# Patient Record
Sex: Female | Born: 1995 | Race: White | Hispanic: No | Marital: Single | Smoking: Never smoker
Health system: Southern US, Community
[De-identification: ages and names within clinical notes are randomized; demographics above are authoritative.]

## PROBLEM LIST (undated history)

## (undated) DIAGNOSIS — J302 Other seasonal allergic rhinitis: Secondary | ICD-10-CM

## (undated) DIAGNOSIS — E039 Hypothyroidism, unspecified: Secondary | ICD-10-CM

## (undated) DIAGNOSIS — F4310 Post-traumatic stress disorder, unspecified: Secondary | ICD-10-CM

## (undated) DIAGNOSIS — F329 Major depressive disorder, single episode, unspecified: Secondary | ICD-10-CM

## (undated) DIAGNOSIS — N200 Calculus of kidney: Secondary | ICD-10-CM

## (undated) DIAGNOSIS — F32A Depression, unspecified: Secondary | ICD-10-CM

## (undated) HISTORY — DX: Calculus of kidney: N20.0

---
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
## 2017-08-24 ENCOUNTER — Other Ambulatory Visit: Payer: Self-pay

## 2017-08-24 DIAGNOSIS — R102 Pelvic and perineal pain: Secondary | ICD-10-CM | POA: Diagnosis not present

## 2017-08-24 DIAGNOSIS — N938 Other specified abnormal uterine and vaginal bleeding: Secondary | ICD-10-CM | POA: Insufficient documentation

## 2017-08-24 LAB — CBC
HEMATOCRIT: 41.7 % (ref 35.0–47.0)
HEMOGLOBIN: 14 g/dL (ref 12.0–16.0)
MCH: 26.4 pg (ref 26.0–34.0)
MCHC: 33.6 g/dL (ref 32.0–36.0)
MCV: 78.7 fL — AB (ref 80.0–100.0)
Platelets: 339 10*3/uL (ref 150–440)
RBC: 5.29 MIL/uL — ABNORMAL HIGH (ref 3.80–5.20)
RDW: 14.9 % — ABNORMAL HIGH (ref 11.5–14.5)
WBC: 8 10*3/uL (ref 3.6–11.0)

## 2017-08-24 LAB — BASIC METABOLIC PANEL WITH GFR
Anion gap: 8 (ref 5–15)
BUN: 12 mg/dL (ref 6–20)
CO2: 26 mmol/L (ref 22–32)
Calcium: 9.6 mg/dL (ref 8.9–10.3)
Chloride: 102 mmol/L (ref 101–111)
Creatinine, Ser: 0.7 mg/dL (ref 0.44–1.00)
GFR calc Af Amer: >60 mL/min
GFR calc non Af Amer: >60 mL/min
Glucose, Bld: 90 mg/dL (ref 65–99)
Potassium: 3.9 mmol/L (ref 3.5–5.1)
Sodium: 136 mmol/L (ref 135–145)

## 2017-08-24 LAB — URINALYSIS, COMPLETE (UACMP) WITH MICROSCOPIC
Bilirubin Urine: NEGATIVE
Glucose, UA: NEGATIVE mg/dL
Ketones, ur: NEGATIVE mg/dL
Leukocytes, UA: NEGATIVE
Nitrite: NEGATIVE
PROTEIN: NEGATIVE mg/dL
Specific Gravity, Urine: 1.027 (ref 1.005–1.030)
pH: 5 (ref 5.0–8.0)

## 2017-08-24 LAB — LIPASE, BLOOD: LIPASE: 23 U/L (ref 11–51)

## 2017-08-24 LAB — POCT PREGNANCY, URINE: Preg Test, Ur: NEGATIVE

## 2017-08-24 NOTE — ED Triage Notes (Signed)
Reports feeling light headed for the past 4 days with "hot" flashes and abdominal pain.

## 2017-08-25 ENCOUNTER — Emergency Department
Admission: EM | Admit: 2017-08-25 | Discharge: 2017-08-25 | Disposition: A | Discharge status: Home / Self Care | Payer: BLUE CROSS/BLUE SHIELD | Attending: Emergency Medicine | Admitting: Emergency Medicine

## 2017-08-25 ENCOUNTER — Emergency Department: Payer: BLUE CROSS/BLUE SHIELD

## 2017-08-25 DIAGNOSIS — N939 Abnormal uterine and vaginal bleeding, unspecified: Secondary | ICD-10-CM

## 2017-08-25 DIAGNOSIS — R102 Pelvic and perineal pain: Secondary | ICD-10-CM

## 2017-08-25 DIAGNOSIS — R52 Pain, unspecified: Secondary | ICD-10-CM

## 2017-08-25 LAB — WET PREP, GENITAL
Clue Cells Wet Prep HPF POC: NONE SEEN
Sperm: NONE SEEN
Trich, Wet Prep: NONE SEEN
WBC, Wet Prep HPF POC: NONE SEEN
YEAST WET PREP: NONE SEEN

## 2017-08-25 LAB — CHLAMYDIA/NGC RT PCR (ARMC ONLY)
CHLAMYDIA TR: NOT DETECTED
N gonorrhoeae: NOT DETECTED

## 2017-08-25 MED ORDER — HYDROCODONE-ACETAMINOPHEN 5-325 MG PO TABS
1.0000 | ORAL_TABLET | Freq: Once | ORAL | Status: AC
  Administered 2017-08-25: 1 via ORAL
  Filled 2017-08-25: qty 1

## 2017-08-25 MED ORDER — HYDROCODONE-ACETAMINOPHEN 5-325 MG PO TABS: 1.0000 | ORAL_TABLET | Freq: Four times a day (QID) | ORAL | 0 refills | Status: DC | PRN

## 2017-08-25 MED ORDER — IBUPROFEN 800 MG PO TABS
800.0000 mg | ORAL_TABLET | Freq: Once | ORAL | Status: AC
  Administered 2017-08-25: 800 mg via ORAL
  Filled 2017-08-25: qty 1

## 2017-08-25 MED ORDER — IBUPROFEN 800 MG PO TABS: 800.0000 mg | ORAL_TABLET | Freq: Three times a day (TID) | ORAL | 0 refills | Status: DC | PRN

## 2017-08-25 MED ORDER — SODIUM CHLORIDE 0.9 % IV BOLUS
1000.0000 mL | Freq: Once | INTRAVENOUS | Status: AC
  Administered 2017-08-25: 1000 mL via INTRAVENOUS

## 2017-08-25 NOTE — ED Notes (Signed)
Patient tolerated graham crackers. Patient denies nausea at this time.

## 2017-08-25 NOTE — ED Notes (Signed)
Patient given crackers and water at this time.  

## 2017-08-25 NOTE — ED Notes (Signed)
ED Provider at bedside. 

## 2017-08-25 NOTE — ED Provider Notes (Signed)
[facility] Emergency Department Provider Note   ____________________________________________   First MD Initiated Contact with Patient 08/25/17 [phone]     (approximate)  I have reviewed the triage vital signs and the nursing notes.   HISTORY  Chief Complaint Dizziness    HPI Sheryl Tucker is a 22 y.o. female who presents to the ED from home with a chief complaint of vaginal spotting, "hot flashes", pelvic pain and lightheadedness.  Patient reports she has not had a period in 8 months.  States she was placed on thyroid medicine at that time for irregular menstrual periods but she never refilled her medicine because she moved here from [place].  Started spotting 4 days ago.  Associated with pelvic discomfort, hot flashes and feeling lightheaded.  Patient has never been sexually active.  Denies vaginal discharge or dysuria.  Denies fever, chills, chest pain, shortness of breath, nausea, vomiting.  Denies recent travel or trauma.   Past medical history None  There are no active problems to display for this patient.    Prior to Admission medications   Not on File    Allergies Patient has no known allergies.  No family history on file.  Social History Social History   Tobacco Use  . Smoking status: Not on file  Substance Use Topics  . Alcohol use: Not on file  . Drug use: Not on file  Non-smoker  Review of Systems  Constitutional: No fever/chills. Eyes: No visual changes. ENT: No sore throat. Cardiovascular: Denies chest pain. Respiratory: Denies shortness of breath. Gastrointestinal: No abdominal pain.  No nausea, no vomiting.  No diarrhea.  No constipation. Genitourinary: Positive for vaginal spotting and pelvic pain.  Negative for dysuria. Musculoskeletal: Negative for back pain. Skin: Negative for rash. Neurological: Negative for headaches, focal weakness or numbness.   ____________________________________________   PHYSICAL  EXAM:  VITAL SIGNS: ED Triage Vitals  Enc Vitals Group     BP 08/24/17 2133 116/79     Pulse Rate 08/24/17 2133 88     Resp 08/24/17 2133 18     Temp 08/24/17 2133 98 F (36.7 C)     Temp Source 08/24/17 2133 Oral     SpO2 08/24/17 2133 99 %     Weight 08/24/17 2131 250 lb (113.4 kg)     Height 08/24/17 2131  (1.6 m)     Head Circumference --      Peak Flow --      Pain Score 08/24/17 2131 2     Pain Loc --      Pain Edu? --      Excl. in GC? --     Constitutional: Alert and oriented. Well appearing and in no acute distress. Eyes: Conjunctivae are normal. PERRL. EOMI. Head: Atraumatic. Nose: No congestion/rhinnorhea. Mouth/Throat: Mucous membranes are moist.  Oropharynx non-erythematous. Neck: No stridor.   Cardiovascular: Normal rate, regular rhythm. Grossly normal heart sounds.  Good peripheral circulation. Respiratory: Normal respiratory effort.  No retractions. Lungs CTAB. Gastrointestinal: Soft and nontender to light or deep palpation. No distention. No abdominal bruits. No CVA tenderness. Musculoskeletal: No lower extremity tenderness nor edema.  No joint effusions. Neurologic:  Normal speech and language. No gross focal neurologic deficits are appreciated. No gait instability. Skin:  Skin is warm, dry and intact. No rash noted. Psychiatric: Mood and affect are normal. Speech and behavior are normal.  ____________________________________________   LABS (all labs ordered are listed, but only abnormal results are displayed)  Labs  Reviewed  CBC - Abnormal; Notable for the following components:      Result Value   RBC 5.29 (*)    MCV 78.7 (*)    RDW 14.9 (*)    All other components within normal limits  URINALYSIS, COMPLETE (UACMP) WITH MICROSCOPIC - Abnormal; Notable for the following components:   Color, Urine YELLOW (*)    APPearance CLOUDY (*)    Hgb urine dipstick LARGE (*)    Bacteria, UA RARE (*)    All other components within normal limits  WET  PREP, GENITAL  CHLAMYDIA/NGC RT PCR ([facility] ONLY)  BASIC METABOLIC PANEL  LIPASE, BLOOD  POCT PREGNANCY, URINE  POC URINE PREG, ED   ____________________________________________  EKG  None ____________________________________________  RADIOLOGY  ED MD interpretation: Normal pelvic ultrasound  Official radiology report(s): US Pelvis Complete  Result Date: 08/25/2017 CLINICAL DATA:  Vaginal spotting without menstruation EXAM: TRANSABDOMINAL ULTRASOUND OF PELVIS DOPPLER ULTRASOUND OF OVARIES TECHNIQUE: Transabdominal ultrasound examination of the pelvis was performed including evaluation of the uterus, ovaries, adnexal regions, and pelvic cul-de-sac. Color and duplex Doppler ultrasound was utilized to evaluate blood flow to the ovaries. COMPARISON:  None. FINDINGS: Uterus Measurements: 7.4 x 3.7 x 4.8 cm. No fibroids or other mass visualized. Endometrium Thickness: 2.9 mm.  No focal abnormality visualized. Right ovary Measurements: 3.5 x 2.1 x 2.1 cm. Normal appearance/no adnexal mass. Left ovary Measurements: 4.1 x 2.1 x 2.6 cm. Normal appearance/no adnexal mass. Pulsed Doppler evaluation demonstrates normal low-resistance arterial and venous waveforms in both ovaries. Other: None IMPRESSION: Normal pelvic ultrasound and Doppler examination. Electronically Signed   By: Deatra Robinson M.D.   On: 08/25/2017 04:32   US Pelvic Doppler (torsion R/o Or Mass Arterial Flow)  Result Date: 08/25/2017 CLINICAL DATA:  Vaginal spotting without menstruation EXAM: TRANSABDOMINAL ULTRASOUND OF PELVIS DOPPLER ULTRASOUND OF OVARIES TECHNIQUE: Transabdominal ultrasound examination of the pelvis was performed including evaluation of the uterus, ovaries, adnexal regions, and pelvic cul-de-sac. Color and duplex Doppler ultrasound was utilized to evaluate blood flow to the ovaries. COMPARISON:  None. FINDINGS: Uterus Measurements: 7.4 x 3.7 x 4.8 cm. No fibroids or other mass visualized. Endometrium  Thickness: 2.9 mm.  No focal abnormality visualized. Right ovary Measurements: 3.5 x 2.1 x 2.1 cm. Normal appearance/no adnexal mass. Left ovary Measurements: 4.1 x 2.1 x 2.6 cm. Normal appearance/no adnexal mass. Pulsed Doppler evaluation demonstrates normal low-resistance arterial and venous waveforms in both ovaries. Other: None IMPRESSION: Normal pelvic ultrasound and Doppler examination. Electronically Signed   By: Deatra Robinson M.D.   On: 08/25/2017 04:32    ____________________________________________   PROCEDURES  Procedure(s) performed:   Pelvic exam: External exam within normal limits without rashes, lesions or vesicles.  Speculum exam reveals old blood.  Cervical loss is closed.  Bimanual exam unremarkable.  Procedures  Critical Care performed: No  ____________________________________________   INITIAL IMPRESSION / ASSESSMENT AND PLAN / ED COURSE  As part of my medical decision making, I reviewed the following data within the electronic MEDICAL RECORD NUMBER Nursing notes reviewed and incorporated, Labs reviewed, Old chart reviewed, Radiograph reviewed  and Notes from prior ED visits   22 year old female with irregular periods who presents with vaginal spotting and pelvic discomfort. Differential diagnosis includes, but is not limited to, ovarian cyst, ovarian torsion, acute appendicitis, diverticulitis, urinary tract infection/pyelonephritis, endometriosis, bowel obstruction, colitis, renal colic, gastroenteritis, hernia, fibroids, endometriosis, pregnancy related pain including ectopic pregnancy, etc.   Laboratory and urinalysis results unremarkable.  Will proceed to pelvic  ultrasound.   Clinical Course as of Aug 26 451  Sun Aug 25, 2017  0448 Updated patient and mother of ultrasound results.  Most likely patient is having the start of her menstrual period.  Will refer to GYN for follow-up.  Strict return precautions given.  Both verbalize understanding and agree with plan  of care.   [JS]    Clinical Course User Index [JS] Irean Hong, MD     ____________________________________________   FINAL CLINICAL IMPRESSION(S) / ED DIAGNOSES  Final diagnoses:  Pain  Vaginal spotting  Pelvic pain     ED Discharge Orders    None       Note:  This document was prepared using Dragon voice recognition software and may include unintentional dictation errors.    Irean Hong, MD 08/25/17 [phone]

## 2017-08-25 NOTE — ED Notes (Signed)
Patient transported to CT 

## 2017-08-25 NOTE — Discharge Instructions (Addendum)
1.  You may take pain medicines as needed (Motrin/Norco #15). °2.  Return to the ER for worsening symptoms, persistent vomiting, difficulty breathing or other concerns. °

## 2017-10-16 ENCOUNTER — Encounter: Payer: Self-pay | Admitting: *Deleted

## 2017-10-16 ENCOUNTER — Other Ambulatory Visit: Payer: Self-pay

## 2017-10-16 DIAGNOSIS — R590 Localized enlarged lymph nodes: Secondary | ICD-10-CM | POA: Diagnosis not present

## 2017-10-16 DIAGNOSIS — R221 Localized swelling, mass and lump, neck: Secondary | ICD-10-CM | POA: Diagnosis present

## 2017-10-16 NOTE — ED Triage Notes (Signed)
Pt reports swelling to right side of neck.  Pt states no sore throat.  No acute resp distress.  Pt alert.  Speech clear.

## 2017-10-17 ENCOUNTER — Emergency Department
Admission: EM | Admit: 2017-10-17 | Discharge: 2017-10-17 | Disposition: A | Discharge status: Home / Self Care | Payer: BLUE CROSS/BLUE SHIELD | Attending: Emergency Medicine | Admitting: Emergency Medicine

## 2017-10-17 DIAGNOSIS — R590 Localized enlarged lymph nodes: Secondary | ICD-10-CM

## 2017-10-17 MED ORDER — AMOXICILLIN 500 MG PO CAPS: 500.0000 mg | ORAL_CAPSULE | Freq: Three times a day (TID) | ORAL | 0 refills | Status: DC

## 2017-10-17 MED ORDER — AMOXICILLIN 500 MG PO CAPS
500.0000 mg | ORAL_CAPSULE | Freq: Once | ORAL | Status: AC
  Administered 2017-10-17: 500 mg via ORAL
  Filled 2017-10-17: qty 1

## 2017-10-17 NOTE — Discharge Instructions (Addendum)
1.  Start antibiotic as prescribed (Amoxicillin 500 mg 3 times daily x7 days). 2.  Return to the ER for worsening symptoms, persistent vomiting, difficulty breathing or other concerns.

## 2017-10-17 NOTE — ED Provider Notes (Signed)
[facility] Emergency Department Provider Note   ____________________________________________   First MD Initiated Contact with Patient 10/17/17 0254     (approximate)  I have reviewed the triage vital signs and the nursing notes.   HISTORY  Chief Complaint Lymphadenopathy    HPI Sheryl Tucker is a 22 y.o. female who presents to the ED from home with a chief complaint of right-sided neck swelling.  Patient noted painful right lump to the right side of her neck approximately 4 hours prior to arrival.  Her mother noted her neck was swollen.  Patient denies fever, chills, ear pain, sore throat, chest pain, shortness of breath, abdominal pain, nausea or vomiting.   Past medical history None  There are no active problems to display for this patient.    Prior to Admission medications   Medication Sig Start Date End Date Taking? Authorizing Provider  amoxicillin (AMOXIL) 500 MG capsule Take 1 capsule (500 mg total) by mouth 3 (three) times daily. 10/17/17   Irean Hong, MD  HYDROcodone-acetaminophen (NORCO) 5-325 MG tablet Take 1 tablet by mouth every 6 (six) hours as needed for moderate pain. 08/25/17   Irean Hong, MD  ibuprofen (ADVIL,MOTRIN) 800 MG tablet Take 1 tablet (800 mg total) by mouth every 8 (eight) hours as needed for moderate pain. 08/25/17   Irean Hong, MD    Allergies Patient has no known allergies.  No family history on file.  Social History Social History   Tobacco Use  . Smoking status: Never Smoker  . Smokeless tobacco: Never Used  Substance Use Topics  . Alcohol use: Never    Frequency: Never  . Drug use: Never    Review of Systems  Constitutional: No fever/chills Eyes: No visual changes. ENT: Right neck swelling.  No sore throat. Cardiovascular: Denies chest pain. Respiratory: Denies shortness of breath. Gastrointestinal: No abdominal pain.  No nausea, no vomiting.  No diarrhea.  No constipation. Genitourinary:  Negative for dysuria. Musculoskeletal: Negative for back pain. Skin: Negative for rash. Neurological: Negative for headaches, focal weakness or numbness.   ____________________________________________   PHYSICAL EXAM:  VITAL SIGNS: ED Triage Vitals  Enc Vitals Group     BP 10/16/17 2227 (!) 133/91     Pulse Rate 10/16/17 2224 77     Resp 10/16/17 2224 20     Temp 10/16/17 2224 98.9 F (37.2 C)     Temp Source 10/16/17 2224 Oral     SpO2 10/16/17 2224 99 %     Weight 10/16/17 2225 250 lb (113.4 kg)     Height 10/16/17 2225 5\' 4"  (1.626 m)     Head Circumference --      Peak Flow --      Pain Score 10/16/17 2225 4     Pain Loc --      Pain Edu? --      Excl. in GC? --     Constitutional: Alert and oriented. Well appearing and in no acute distress. Eyes: Conjunctivae are normal. PERRL. EOMI. Head: Atraumatic. Ears: Bilateral TMs unremarkable. Nose: No congestion/rhinnorhea. Mouth/Throat: Mucous membranes are moist.  Oropharynx non-erythematous. Neck: No stridor.  Mild right neck swelling secondary to lymphadenopathy. Hematological/Lymphatic/Immunilogical: Right cervical lymphadenopathy. Cardiovascular: Normal rate, regular rhythm. Grossly normal heart sounds.  Good peripheral circulation. Respiratory: Normal respiratory effort.  No retractions. Lungs CTAB. Gastrointestinal: Soft and nontender. No distention. No abdominal bruits. No CVA tenderness. Musculoskeletal: No lower extremity tenderness nor edema.  No joint effusions.  Neurologic:  Normal speech and language. No gross focal neurologic deficits are appreciated. No gait instability. Skin:  Skin is warm, dry and intact. No rash noted. Psychiatric: Mood and affect are normal. Speech and behavior are normal.  ____________________________________________   LABS (all labs ordered are listed, but only abnormal results are displayed)  Labs Reviewed - No data to  display ____________________________________________  EKG  None ____________________________________________  RADIOLOGY  ED MD interpretation: None  Official radiology report(s): No results found.  ____________________________________________   PROCEDURES  Procedure(s) performed: None  Procedures  Critical Care performed: No  ____________________________________________   INITIAL IMPRESSION / ASSESSMENT AND PLAN / ED COURSE  As part of my medical decision making, I reviewed the following data within the electronic MEDICAL RECORD NUMBER Nursing notes reviewed and incorporated and Notes from prior ED visits   22 year old female who presents with reactive right anterior cervical lymphadenopathy.  No angioedema or respiratory distress.  Room air saturations 98 to 100%.  Will treat with amoxicillin and patient will follow-up closely with her PCP.  Strict return precautions given.  Patient verbalizes understanding agrees with plan of care.      ____________________________________________   FINAL CLINICAL IMPRESSION(S) / ED DIAGNOSES  Final diagnoses:  Lymphadenopathy, anterior cervical     ED Discharge Orders        Ordered    amoxicillin (AMOXIL) 500 MG capsule  3 times daily     10/17/17 0303       Note:  This document was prepared using Dragon voice recognition software and may include unintentional dictation errors.    Irean HongSung, Haani Bakula J, MD 10/17/17 [phone]

## 2017-10-17 NOTE — ED Notes (Signed)
Pt states that she has had increased swelling on her neck that she thinks is "from her lymphnodes swelling." No distress noted.

## 2018-02-20 ENCOUNTER — Encounter: Payer: Self-pay | Admitting: Emergency Medicine

## 2018-02-20 ENCOUNTER — Emergency Department
Admission: EM | Admit: 2018-02-20 | Discharge: 2018-02-20 | Disposition: A | Discharge status: Home / Self Care | Payer: Medicaid Other | Attending: Emergency Medicine | Admitting: Emergency Medicine

## 2018-02-20 DIAGNOSIS — H6503 Acute serous otitis media, bilateral: Secondary | ICD-10-CM | POA: Diagnosis not present

## 2018-02-20 DIAGNOSIS — Z79899 Other long term (current) drug therapy: Secondary | ICD-10-CM | POA: Diagnosis not present

## 2018-02-20 DIAGNOSIS — B9789 Other viral agents as the cause of diseases classified elsewhere: Secondary | ICD-10-CM

## 2018-02-20 DIAGNOSIS — R05 Cough: Secondary | ICD-10-CM | POA: Diagnosis not present

## 2018-02-20 DIAGNOSIS — J069 Acute upper respiratory infection, unspecified: Secondary | ICD-10-CM | POA: Diagnosis not present

## 2018-02-20 DIAGNOSIS — R0981 Nasal congestion: Secondary | ICD-10-CM | POA: Diagnosis not present

## 2018-02-20 DIAGNOSIS — H9203 Otalgia, bilateral: Secondary | ICD-10-CM | POA: Diagnosis present

## 2018-02-20 MED ORDER — FLUTICASONE PROPIONATE 50 MCG/ACT NA SUSP: 2.0000 | Freq: Every day | NASAL | 0 refills | Status: DC

## 2018-02-20 MED ORDER — PREDNISONE 10 MG (21) PO TBPK: ORAL_TABLET | ORAL | 0 refills | Status: DC

## 2018-02-20 MED ORDER — CETIRIZINE-PSEUDOEPHEDRINE ER 5-120 MG PO TB12: 1.0000 | ORAL_TABLET | Freq: Two times a day (BID) | ORAL | 0 refills | Status: DC

## 2018-02-20 MED ORDER — BENZONATATE 100 MG PO CAPS
ORAL_CAPSULE | ORAL | 0 refills | Status: DC
Start: 2018-02-20 — End: 2018-03-02

## 2018-02-20 NOTE — ED Provider Notes (Signed)
[facility] Emergency Department Provider Note ____________________________________________  Time seen: 1501  I have reviewed the triage vital signs and the nursing notes.  HISTORY  Chief Complaint  Otalgia and Nasal Congestion  HPI Raynald Kemplexis R Matlack is a 22 y.o. female who presents herself to the ED for evaluation of a 2-week amount of intermittent cough and nasal congestion patient also had some earache and pressure for the last 2 to 3 days bilaterally.  She denies any interim fevers, chills, or sweats.  Patient denies any significant benefit with over-the-counter nasal decongestants and cough drops.  History reviewed. No pertinent past medical history.  There are no active problems to display for this patient.  History reviewed. No pertinent surgical history.  Prior to Admission medications   Medication Sig Start Date End Date Taking? Authorizing Provider  amoxicillin (AMOXIL) 500 MG capsule Take 1 capsule (500 mg total) by mouth 3 (three) times daily. 10/17/17   Irean HongSung, Jade J, MD  benzonatate (TESSALON PERLES) 100 MG capsule Take 1-2 tabs TID prn cough 02/20/18   Troy Hartzog, Charlesetta IvoryJenise V Bacon, PA-C  cetirizine-pseudoephedrine (ZYRTEC-D) 5-120 MG tablet Take 1 tablet by mouth 2 (two) times daily for 15 days. 02/20/18 03/07/18  Jorian Willhoite, Charlesetta IvoryJenise V Bacon, PA-C  fluticasone (FLONASE) 50 MCG/ACT nasal spray Place 2 sprays into both nostrils daily. 02/20/18   Sriram Febles, Charlesetta IvoryJenise V Bacon, PA-C  HYDROcodone-acetaminophen (NORCO) 5-325 MG tablet Take 1 tablet by mouth every 6 (six) hours as needed for moderate pain. 08/25/17   Irean HongSung, Jade J, MD  ibuprofen (ADVIL,MOTRIN) 800 MG tablet Take 1 tablet (800 mg total) by mouth every 8 (eight) hours as needed for moderate pain. 08/25/17   Irean HongSung, Jade J, MD  predniSONE (STERAPRED UNI-PAK 21 TAB) 10 MG (21) TBPK tablet 6-day taper as directed. 02/20/18   Brittie Whisnant, Charlesetta IvoryJenise V Bacon, PA-C    Allergies Patient has no known allergies.  No family  history on file.  Social History Social History   Tobacco Use  . Smoking status: Never Smoker  . Smokeless tobacco: Never Used  Substance Use Topics  . Alcohol use: Never    Frequency: Never  . Drug use: Never    Review of Systems  Constitutional: Negative for fever. Eyes: Negative for visual changes. ENT: Negative for sore throat. Reports ear fullness bilaterally. Cardiovascular: Negative for chest pain. Respiratory: Negative for shortness of breath. Gastrointestinal: Negative for abdominal pain, vomiting and diarrhea. Musculoskeletal: Negative for back pain. Skin: Negative for rash. Neurological: Negative for headaches, focal weakness or numbness. ____________________________________________  PHYSICAL EXAM:  VITAL SIGNS: ED Triage Vitals  Enc Vitals Group     BP 02/20/18 1350 125/65     Pulse Rate 02/20/18 1350 86     Resp 02/20/18 1350 16     Temp 02/20/18 1350 98.4 F (36.9 C)     Temp Source 02/20/18 1350 Oral     SpO2 02/20/18 1350 99 %     Weight 02/20/18 1347 250 lb (113.4 kg)     Height 02/20/18 1347 5\' 4"  (1.626 m)     Head Circumference --      Peak Flow --      Pain Score 02/20/18 1347 1     Pain Loc --      Pain Edu? --      Excl. in GC? --    Constitutional: Alert and oriented. Well appearing and in no distress. Head: Normocephalic and atraumatic. Eyes: Conjunctivae are normal. PERRL. Normal extraocular movements Ears: Canals clear.  TMs intact bilaterally. TMs are erythematous, injected, and retracted with serous effusions noted bilaterally.  Nose: No congestion/rhinorrhea/epistaxis. Mouth/Throat: Mucous membranes are moist.  Uvula is midline and tonsils are flat.  No oropharyngeal lesions are appreciated. Neck: Supple. No thyromegaly. Hematological/Lymphatic/Immunological: No cervical lymphadenopathy. Cardiovascular: Normal rate, regular rhythm. Normal distal pulses. Respiratory: Normal respiratory effort. No  wheezes/rales/rhonchi. Gastrointestinal: Soft and nontender. No distention. ____________________________________________  PROCEDURES  Procedures ____________________________________________  INITIAL IMPRESSION / ASSESSMENT AND PLAN / ED COURSE  Patient with ED ED evaluation of bilateral ear pressure and fullness as well as intermittent sinus congestion drainage.  Patient symptoms likely represent a viral etiology.  She is found to have bilateral serous effusions and some sinus pressure.  She will be treated with cetirizine-pseudoephedrine, Tessalon Perles, Flonase, and prednisone.  She will follow with primary provider at [facility] for ongoing symptom management. ____________________________________________  FINAL CLINICAL IMPRESSION(S) / ED DIAGNOSES  Final diagnoses:  Viral URI with cough  Non-recurrent acute serous otitis media of both ears      Khiem Gargis, Charlesetta Ivory, PA-C 02/20/18 1539    [facility], MD 02/20/18 1600

## 2018-02-20 NOTE — Discharge Instructions (Signed)
You have some symptoms of a viral URI. You have some fluid in behind the ear drums and sinus congestion. Take the prescription meds as directed. [facility] as needed.

## 2018-02-20 NOTE — ED Triage Notes (Signed)
Pt reports for the past week has had nasal congestion and ear pain. Pt reports OTC meds for nasal congestion not working. Pt reports both ears hurt but right is worse.

## 2018-03-02 ENCOUNTER — Other Ambulatory Visit: Payer: Self-pay

## 2018-03-02 ENCOUNTER — Emergency Department
Admission: EM | Admit: 2018-03-02 | Discharge: 2018-03-02 | Disposition: A | Discharge status: Home / Self Care | Payer: Medicaid Other | Attending: Emergency Medicine | Admitting: Emergency Medicine

## 2018-03-02 ENCOUNTER — Encounter: Payer: Self-pay | Admitting: Emergency Medicine

## 2018-03-02 ENCOUNTER — Emergency Department: Payer: Medicaid Other

## 2018-03-02 DIAGNOSIS — J181 Lobar pneumonia, unspecified organism: Secondary | ICD-10-CM

## 2018-03-02 DIAGNOSIS — J189 Pneumonia, unspecified organism: Secondary | ICD-10-CM | POA: Insufficient documentation

## 2018-03-02 DIAGNOSIS — R05 Cough: Secondary | ICD-10-CM | POA: Diagnosis present

## 2018-03-02 LAB — INFLUENZA PANEL BY PCR (TYPE A & B)
INFLBPCR: NEGATIVE
Influenza A By PCR: NEGATIVE

## 2018-03-02 MED ORDER — SODIUM CHLORIDE 0.9 % IV SOLN: 1.0000 g | Freq: Once | INTRAVENOUS | Status: DC

## 2018-03-02 MED ORDER — AZITHROMYCIN 250 MG PO TABS: ORAL_TABLET | ORAL | 0 refills | Status: DC

## 2018-03-02 MED ORDER — LIDOCAINE HCL (PF) 1 % IJ SOLN
INTRAMUSCULAR | Status: AC
  Filled 2018-03-02: qty 5

## 2018-03-02 MED ORDER — PSEUDOEPH-BROMPHEN-DM 30-2-10 MG/5ML PO SYRP: 5.0000 mL | ORAL_SOLUTION | Freq: Four times a day (QID) | ORAL | 0 refills | Status: DC | PRN

## 2018-03-02 MED ORDER — CEFTRIAXONE SODIUM 1 G IJ SOLR
1.0000 g | Freq: Once | INTRAMUSCULAR | Status: AC
  Administered 2018-03-02: 1 g via INTRAMUSCULAR
  Filled 2018-03-02: qty 10

## 2018-03-02 NOTE — ED Provider Notes (Signed)
[facility] Emergency Department Provider Note  ____________________________________________   First MD Initiated Contact with Patient 03/02/18 1154     (approximate)  I have reviewed the triage vital signs and the nursing notes.   HISTORY  Chief Complaint Cough  HPI Sheryl Tucker is a 22 y.o. female patient presents to the ED with complaint of cold symptoms for 1-1/2 weeks.  She complains that in the last 4 days her cough has become productive, she has had fever and now her skin is sensitive to touch.  Patient has been taking over-the-counter medication for cough and congestion.  Patient is non-smoker.   History reviewed. No pertinent past medical history.  There are no active problems to display for this patient.   History reviewed. No pertinent surgical history.  Prior to Admission medications   Medication Sig Start Date End Date Taking? Authorizing Provider  azithromycin (ZITHROMAX Z-PAK) 250 MG tablet Take 2 tablets (500 mg) on  Day 1,  followed by 1 tablet (250 mg) once daily on Days 2 through 5. 03/02/18   Bridget Hartshorn L, PA-C  brompheniramine-pseudoephedrine-DM 30-2-10 MG/5ML syrup Take 5 mLs by mouth 4 (four) times daily as needed. 03/02/18   Tommi Rumps, PA-C  fluticasone (FLONASE) 50 MCG/ACT nasal spray Place 2 sprays into both nostrils daily. 02/20/18   Menshew, Charlesetta Ivory, PA-C  predniSONE (STERAPRED UNI-PAK 21 TAB) 10 MG (21) TBPK tablet 6-day taper as directed. 02/20/18   Menshew, Charlesetta Ivory, PA-C    Allergies Patient has no known allergies.  No family history on file.  Social History Social History   Tobacco Use  . Smoking status: Never Smoker  . Smokeless tobacco: Never Used  Substance Use Topics  . Alcohol use: Never    Frequency: Never  . Drug use: Never    Review of Systems Constitutional: Positive fever/chills Eyes: No visual changes. ENT: No sore throat.  Negative for ear pain. Cardiovascular:  Denies chest pain. Respiratory: Denies shortness of breath.  Positive for productive cough Gastrointestinal: No abdominal pain.  No nausea, no vomiting.  No diarrhea.  Musculoskeletal: Negative for muscle aches. Skin: Negative for rash.  Positive for "sensitive skin". Neurological: Negative for headaches, focal weakness or numbness. ___________________________________________   PHYSICAL EXAM:  VITAL SIGNS: ED Triage Vitals  Enc Vitals Group     BP 03/02/18 1054 117/75     Pulse Rate 03/02/18 1054 (!) 108     Resp 03/02/18 1054 18     Temp 03/02/18 1054 99.1 F (37.3 C)     Temp Source 03/02/18 1054 Oral     SpO2 03/02/18 1054 95 %     Weight 03/02/18 1055 250 lb (113.4 kg)     Height 03/02/18 1055 5\' 4"  (1.626 m)     Head Circumference --      Peak Flow --      Pain Score --      Pain Loc --      Pain Edu? --      Excl. in GC? --    Constitutional: Alert and oriented. Well appearing and in no acute distress. Eyes: Conjunctivae are normal. PERRL. EOMI. Head: Atraumatic. Nose: No congestion/rhinnorhea.  Mouth/Throat: Mucous membranes are moist.  Oropharynx non-erythematous. Neck: No stridor.   Hematological/Lymphatic/Immunilogical: No cervical lymphadenopathy. Cardiovascular: Normal rate, regular rhythm. Grossly normal heart sounds.  Good peripheral circulation. Respiratory: Normal respiratory effort.  No retractions. Lungs right lower lung with rales.  No wheezes were heard.  Patient has a very congested cough. Gastrointestinal: Soft and nontender. No distention.  Musculoskeletal: Moves upper and lower extremities without any difficulty.  Normal gait was noted. Neurologic:  Normal speech and language. No gross focal neurologic deficits are appreciated. No gait instability. Skin:  Skin is warm, dry and intact. No rash noted. Psychiatric: Mood and affect are normal. Speech and behavior are normal.  ____________________________________________   LABS (all labs ordered are  listed, but only abnormal results are displayed)  Labs Reviewed  INFLUENZA PANEL BY PCR (TYPE A & B)    RADIOLOGY  ED MD interpretation: Questionable infiltrate right lower lung.  Official radiology report(s): Dg Chest 2 View  Result Date: 03/02/2018 CLINICAL DATA:  Cough and fever EXAM: CHEST - 2 VIEW COMPARISON:  None. FINDINGS: Normal heart size. Normal mediastinal contour. No pneumothorax. No pleural effusion. Patchy right lower lobe consolidation. Clear left lung. IMPRESSION: Patchy right lower lobe consolidation compatible with pneumonia. Recommend follow-up PA and lateral post treatment chest radiographs in 4-6 weeks. Electronically Signed   By: Delbert PhenixJason A Poff M.D.   On: 03/02/2018 12:57  ____________________________________________   PROCEDURES  Procedure(s) performed: None  Procedures  Critical Care performed: No  ____________________________________________   INITIAL IMPRESSION / ASSESSMENT AND PLAN / ED COURSE  As part of my medical decision making, I reviewed the following data within the electronic MEDICAL RECORD NUMBER Notes from prior ED visits and [place] Controlled Substance Database  22 year old female presents to the ED with complaint of productive cough after having a upper restaurant infection for 1 week.  Patient also relates that she has had fever which she is controlled with over-the-counter medication and feels that her skin is "sensitive".  Patient is a non-smoker.  She denies any other health problems.  On exam right lower lobe is concerning for pneumonia.  X-ray confirms this and patient was made aware.  Patient was given Rocephin 1 g IM along with prescription for Zithromax and a prescription for Bromfed-DM.  Patient is to follow-up with her PCP or [facility] acute care if any continued problems.  She is also to return to the emergency department if any severe worsening of her symptoms.  ____________________________________________   FINAL CLINICAL  IMPRESSION(S) / ED DIAGNOSES  Final diagnoses:  Pneumonia of right lower lobe due to infectious organism [facility](HCC)     ED Discharge Orders         Ordered    azithromycin (ZITHROMAX Z-PAK) 250 MG tablet     03/02/18 1410    brompheniramine-pseudoephedrine-DM 30-2-10 MG/5ML syrup  4 times daily PRN     03/02/18 1410           Note:  This document was prepared using Dragon voice recognition software and may include unintentional dictation errors.    Tommi RumpsSummers, Neala Miggins L, PA-C 03/02/18 1421    Minna AntisPaduchowski, Kevin, MD 03/02/18 1517

## 2018-03-02 NOTE — Discharge Instructions (Signed)
Follow-up with your primary care provider or [facility] acute care if any continued problems.  A prescription for continued antibiotics was sent to your pharmacy as well as cough medication.  You may need to take Tylenol or ibuprofen as needed for fever and body aches.  Drink fluids to stay hydrated.  Return to the emergency department if any severe worsening of your symptoms or urgent concerns.

## 2018-03-02 NOTE — ED Notes (Signed)
Pt verbalized understanding of discharge instructions. NAD at this time. 

## 2018-03-02 NOTE — ED Triage Notes (Signed)
Pt arrived with complaints of productive cough, fever, and sensitive skin for the last 4 days.

## 2018-09-24 ENCOUNTER — Other Ambulatory Visit
Admission: RE | Admit: 2018-09-24 | Discharge: 2018-09-24 | Disposition: A | Discharge status: Home / Self Care | Payer: Medicaid Other | Source: Ambulatory Visit | Attending: Oral Surgery | Admitting: Oral Surgery

## 2018-09-24 ENCOUNTER — Other Ambulatory Visit: Payer: Self-pay

## 2018-09-24 DIAGNOSIS — Z1159 Encounter for screening for other viral diseases: Secondary | ICD-10-CM | POA: Insufficient documentation

## 2018-09-25 ENCOUNTER — Encounter (HOSPITAL_COMMUNITY): Payer: Self-pay | Admitting: *Deleted

## 2018-09-25 ENCOUNTER — Other Ambulatory Visit: Payer: Self-pay

## 2018-09-25 LAB — NOVEL CORONAVIRUS, NAA (HOSP ORDER, SEND-OUT TO REF LAB; TAT 18-24 HRS): SARS-CoV-2, NAA: NOT DETECTED

## 2018-09-25 NOTE — Progress Notes (Signed)
Pre-op instructions completed Patient does not currently have a PCP No cardiology hx  Will  Need hgb and urine hcg day of surgery  Patient stated that she has a cough and a scratchy throat first thing in the morning but is related to allergies.  Patients Covid test still pending

## 2018-09-25 NOTE — Anesthesia Preprocedure Evaluation (Addendum)
Anesthesia Evaluation  Patient identified by MRN, date of birth, ID band Patient awake    Reviewed: Allergy & Precautions, NPO status , Patient's Chart, lab work & pertinent test results  History of Anesthesia Complications Negative for: history of anesthetic complications  Airway Mallampati: II  TM Distance: >3 FB Neck ROM: Full    Dental  (+) Poor Dentition, Dental Advisory Given   Pulmonary neg pulmonary ROS,    Pulmonary exam normal        Cardiovascular negative cardio ROS Normal cardiovascular exam     Neuro/Psych negative neurological ROS     GI/Hepatic negative GI ROS, Neg liver ROS,   Endo/Other  Hypothyroidism Morbid obesity  Renal/GU negative Renal ROS     Musculoskeletal negative musculoskeletal ROS (+)   Abdominal   Peds  Hematology negative hematology ROS (+)   Anesthesia Other Findings Day of surgery medications reviewed with the patient.  Reproductive/Obstetrics                           Anesthesia Physical Anesthesia Plan  ASA: III  Anesthesia Plan: General   Post-op Pain Management:    Induction: Intravenous  PONV Risk Score and Plan: 4 or greater and Ondansetron, Dexamethasone and Scopolamine patch - Pre-op  Airway Management Planned: Nasal ETT  Additional Equipment:   Intra-op Plan:   Post-operative Plan: Extubation in OR  Informed Consent: I have reviewed the patients History and Physical, chart, labs and discussed the procedure including the risks, benefits and alternatives for the proposed anesthesia with the patient or authorized representative who has indicated his/her understanding and acceptance.     Dental advisory given  Plan Discussed with: CRNA and Anesthesiologist  Anesthesia Plan Comments:       Anesthesia Quick Evaluation

## 2018-09-26 ENCOUNTER — Ambulatory Visit (HOSPITAL_COMMUNITY)
Admission: RE | Admit: 2018-09-26 | Discharge: 2018-09-26 | Disposition: A | Discharge status: Home / Self Care | Payer: Medicaid Other | Attending: Oral Surgery | Admitting: Oral Surgery

## 2018-09-26 ENCOUNTER — Ambulatory Visit (HOSPITAL_COMMUNITY): Payer: Medicaid Other | Admitting: Certified Registered Nurse Anesthetist

## 2018-09-26 ENCOUNTER — Encounter (HOSPITAL_COMMUNITY): Payer: Self-pay | Admitting: *Deleted

## 2018-09-26 ENCOUNTER — Encounter (HOSPITAL_COMMUNITY)
Admission: RE | Disposition: A | Discharge status: Home / Self Care | Payer: Self-pay | Source: Home / Self Care | Attending: Oral Surgery

## 2018-09-26 DIAGNOSIS — K011 Impacted teeth: Secondary | ICD-10-CM | POA: Diagnosis present

## 2018-09-26 DIAGNOSIS — J302 Other seasonal allergic rhinitis: Secondary | ICD-10-CM | POA: Insufficient documentation

## 2018-09-26 DIAGNOSIS — Z6841 Body Mass Index (BMI) 40.0 and over, adult: Secondary | ICD-10-CM | POA: Insufficient documentation

## 2018-09-26 HISTORY — PX: TOOTH EXTRACTION: SHX859

## 2018-09-26 HISTORY — DX: Hypothyroidism, unspecified: E03.9

## 2018-09-26 HISTORY — DX: Other seasonal allergic rhinitis: J30.2

## 2018-09-26 HISTORY — DX: Depression, unspecified: F32.A

## 2018-09-26 HISTORY — DX: Post-traumatic stress disorder, unspecified: F43.10

## 2018-09-26 LAB — POCT PREGNANCY, URINE: Preg Test, Ur: NEGATIVE

## 2018-09-26 LAB — HEMOGLOBIN: Hemoglobin: 13.9 g/dL (ref 12.0–15.0)

## 2018-09-26 SURGERY — DENTAL RESTORATION/EXTRACTIONS
Anesthesia: General | Site: Mouth | Laterality: Bilateral

## 2018-09-26 MED ORDER — MIDAZOLAM HCL 5 MG/5ML IJ SOLN
INTRAMUSCULAR | Status: DC | PRN
  Administered 2018-09-26 (×2): 2 mg via INTRAVENOUS

## 2018-09-26 MED ORDER — MIDAZOLAM HCL 2 MG/2ML IJ SOLN
INTRAMUSCULAR | Status: AC
  Filled 2018-09-26: qty 2

## 2018-09-26 MED ORDER — PROMETHAZINE HCL 25 MG/ML IJ SOLN
INTRAMUSCULAR | Status: AC
  Filled 2018-09-26: qty 1

## 2018-09-26 MED ORDER — SCOPOLAMINE 1 MG/3DAYS TD PT72
1.0000 | MEDICATED_PATCH | TRANSDERMAL | Status: DC
  Administered 2018-09-26: 1.5 mg via TRANSDERMAL

## 2018-09-26 MED ORDER — HYDROCODONE-ACETAMINOPHEN 5-325 MG PO TABS: 1.0000 | ORAL_TABLET | Freq: Four times a day (QID) | ORAL | 0 refills | Status: DC | PRN

## 2018-09-26 MED ORDER — SUCCINYLCHOLINE CHLORIDE 200 MG/10ML IV SOSY
PREFILLED_SYRINGE | INTRAVENOUS | Status: DC | PRN
  Administered 2018-09-26: 130 mg via INTRAVENOUS

## 2018-09-26 MED ORDER — DEXTROSE 5 % IV SOLN
3.0000 g | INTRAVENOUS | Status: AC
  Administered 2018-09-26: 3 g via INTRAVENOUS
  Filled 2018-09-26: qty 3

## 2018-09-26 MED ORDER — OXYMETAZOLINE HCL 0.05 % NA SOLN
NASAL | Status: AC
  Filled 2018-09-26: qty 30

## 2018-09-26 MED ORDER — ACETAMINOPHEN 500 MG PO TABS
1000.0000 mg | ORAL_TABLET | Freq: Once | ORAL | Status: AC
  Administered 2018-09-26: 07:00:00 1000 mg via ORAL

## 2018-09-26 MED ORDER — PROPOFOL 10 MG/ML IV BOLUS
INTRAVENOUS | Status: DC | PRN
  Administered 2018-09-26: 200 mg via INTRAVENOUS

## 2018-09-26 MED ORDER — DEXAMETHASONE SODIUM PHOSPHATE 10 MG/ML IJ SOLN
INTRAMUSCULAR | Status: DC | PRN
  Administered 2018-09-26: 10 mg via INTRAVENOUS

## 2018-09-26 MED ORDER — ONDANSETRON HCL 4 MG/2ML IJ SOLN
INTRAMUSCULAR | Status: AC
  Filled 2018-09-26: qty 2

## 2018-09-26 MED ORDER — LIDOCAINE 2% (20 MG/ML) 5 ML SYRINGE
INTRAMUSCULAR | Status: AC
  Filled 2018-09-26: qty 5

## 2018-09-26 MED ORDER — LACTATED RINGERS IV SOLN
INTRAVENOUS | Status: DC | PRN
  Administered 2018-09-26: 07:00:00 via INTRAVENOUS

## 2018-09-26 MED ORDER — FENTANYL CITRATE (PF) 250 MCG/5ML IJ SOLN
INTRAMUSCULAR | Status: AC
  Filled 2018-09-26: qty 5

## 2018-09-26 MED ORDER — FENTANYL CITRATE (PF) 100 MCG/2ML IJ SOLN
25.0000 ug | INTRAMUSCULAR | Status: DC | PRN
  Administered 2018-09-26 (×2): 25 ug via INTRAVENOUS

## 2018-09-26 MED ORDER — PROMETHAZINE HCL 25 MG/ML IJ SOLN
6.2500 mg | INTRAMUSCULAR | Status: DC | PRN
  Administered 2018-09-26: 10:00:00 6.25 mg via INTRAVENOUS

## 2018-09-26 MED ORDER — PHENYLEPHRINE 40 MCG/ML (10ML) SYRINGE FOR IV PUSH (FOR BLOOD PRESSURE SUPPORT)
PREFILLED_SYRINGE | INTRAVENOUS | Status: AC
  Filled 2018-09-26: qty 10

## 2018-09-26 MED ORDER — SODIUM CHLORIDE 0.9 % IR SOLN
Status: DC | PRN
  Administered 2018-09-26: 1000 mL

## 2018-09-26 MED ORDER — FENTANYL CITRATE (PF) 100 MCG/2ML IJ SOLN
INTRAMUSCULAR | Status: AC
  Filled 2018-09-26: qty 2

## 2018-09-26 MED ORDER — LIDOCAINE 2% (20 MG/ML) 5 ML SYRINGE
INTRAMUSCULAR | Status: DC | PRN
  Administered 2018-09-26: 60 mg via INTRAVENOUS

## 2018-09-26 MED ORDER — DEXAMETHASONE SODIUM PHOSPHATE 10 MG/ML IJ SOLN
INTRAMUSCULAR | Status: AC
  Filled 2018-09-26: qty 2

## 2018-09-26 MED ORDER — ROCURONIUM BROMIDE 10 MG/ML (PF) SYRINGE
PREFILLED_SYRINGE | INTRAVENOUS | Status: AC
  Filled 2018-09-26: qty 10

## 2018-09-26 MED ORDER — ACETAMINOPHEN 500 MG PO TABS
ORAL_TABLET | ORAL | Status: AC
  Administered 2018-09-26: 07:00:00 1000 mg via ORAL
  Filled 2018-09-26: qty 2

## 2018-09-26 MED ORDER — ONDANSETRON HCL 4 MG/2ML IJ SOLN
INTRAMUSCULAR | Status: DC | PRN
  Administered 2018-09-26: 4 mg via INTRAVENOUS

## 2018-09-26 MED ORDER — SCOPOLAMINE 1 MG/3DAYS TD PT72
MEDICATED_PATCH | TRANSDERMAL | Status: AC
  Administered 2018-09-26: 1.5 mg via TRANSDERMAL
  Filled 2018-09-26: qty 1

## 2018-09-26 MED ORDER — SUCCINYLCHOLINE CHLORIDE 200 MG/10ML IV SOSY
PREFILLED_SYRINGE | INTRAVENOUS | Status: AC
  Filled 2018-09-26: qty 10

## 2018-09-26 MED ORDER — EPHEDRINE 5 MG/ML INJ
INTRAVENOUS | Status: AC
  Filled 2018-09-26: qty 10

## 2018-09-26 MED ORDER — PHENYLEPHRINE 40 MCG/ML (10ML) SYRINGE FOR IV PUSH (FOR BLOOD PRESSURE SUPPORT)
PREFILLED_SYRINGE | INTRAVENOUS | Status: DC | PRN
  Administered 2018-09-26: 80 ug via INTRAVENOUS

## 2018-09-26 MED ORDER — FENTANYL CITRATE (PF) 250 MCG/5ML IJ SOLN
INTRAMUSCULAR | Status: DC | PRN
  Administered 2018-09-26 (×4): 50 ug via INTRAVENOUS

## 2018-09-26 MED ORDER — AMOXICILLIN 500 MG PO CAPS: 500.0000 mg | ORAL_CAPSULE | Freq: Three times a day (TID) | ORAL | 0 refills | Status: DC

## 2018-09-26 MED ORDER — 0.9 % SODIUM CHLORIDE (POUR BTL) OPTIME
TOPICAL | Status: DC | PRN
  Administered 2018-09-26: 1000 mL

## 2018-09-26 MED ORDER — PROPOFOL 10 MG/ML IV BOLUS
INTRAVENOUS | Status: AC
  Filled 2018-09-26: qty 20

## 2018-09-26 MED ORDER — LIDOCAINE-EPINEPHRINE 2 %-1:100000 IJ SOLN
INTRAMUSCULAR | Status: AC
  Filled 2018-09-26: qty 1

## 2018-09-26 MED ORDER — OXYMETAZOLINE HCL 0.05 % NA SOLN
1.0000 | Freq: Two times a day (BID) | NASAL | Status: DC
  Administered 2018-09-26: 07:00:00 1 via NASAL
  Filled 2018-09-26 (×2): qty 30

## 2018-09-26 MED ORDER — LIDOCAINE-EPINEPHRINE 2 %-1:100000 IJ SOLN
INTRAMUSCULAR | Status: DC | PRN
  Administered 2018-09-26: 10 mL via INTRADERMAL

## 2018-09-26 SURGICAL SUPPLY — 36 items
BLADE SURG 15 STRL LF DISP TIS (BLADE) ×1 IMPLANT
BLADE SURG 15 STRL SS (BLADE) ×1
BUR CROSS CUT FISSURE 1.6 (BURR) ×2 IMPLANT
BUR EGG ELITE 4.0 (BURR) ×2 IMPLANT
CANISTER SUCT 3000ML PPV (MISCELLANEOUS) ×2 IMPLANT
COVER SURGICAL LIGHT HANDLE (MISCELLANEOUS) ×2 IMPLANT
COVER WAND RF STERILE (DRAPES) ×2 IMPLANT
DECANTER SPIKE VIAL GLASS SM (MISCELLANEOUS) ×2 IMPLANT
DRAPE U-SHAPE 76X120 STRL (DRAPES) ×2 IMPLANT
GAUZE PACKING FOLDED 2  STR (GAUZE/BANDAGES/DRESSINGS) ×1
GAUZE PACKING FOLDED 2 STR (GAUZE/BANDAGES/DRESSINGS) ×1 IMPLANT
GLOVE BIO SURGEON STRL SZ 6.5 (GLOVE) IMPLANT
GLOVE BIO SURGEON STRL SZ7 (GLOVE) IMPLANT
GLOVE BIO SURGEON STRL SZ7.5 (GLOVE) ×2 IMPLANT
GLOVE BIOGEL PI IND STRL 6.5 (GLOVE) IMPLANT
GLOVE BIOGEL PI IND STRL 7.0 (GLOVE) IMPLANT
GLOVE BIOGEL PI INDICATOR 6.5 (GLOVE)
GLOVE BIOGEL PI INDICATOR 7.0 (GLOVE) ×1
GOWN STRL REUS W/ TWL LRG LVL3 (GOWN DISPOSABLE) ×1 IMPLANT
GOWN STRL REUS W/ TWL XL LVL3 (GOWN DISPOSABLE) ×1 IMPLANT
GOWN STRL REUS W/TWL LRG LVL3 (GOWN DISPOSABLE) ×1
GOWN STRL REUS W/TWL XL LVL3 (GOWN DISPOSABLE) ×1
IV NS 1000ML (IV SOLUTION) ×1
IV NS 1000ML BAXH (IV SOLUTION) ×1 IMPLANT
KIT BASIN OR (CUSTOM PROCEDURE TRAY) ×2 IMPLANT
KIT TURNOVER KIT B (KITS) ×2 IMPLANT
NEEDLE 22X1 1/2 (OR ONLY) (NEEDLE) ×4 IMPLANT
NS IRRIG 1000ML POUR BTL (IV SOLUTION) ×2 IMPLANT
PAD ARMBOARD 7.5X6 YLW CONV (MISCELLANEOUS) ×2 IMPLANT
SLEEVE IRRIGATION ELITE 7 (MISCELLANEOUS) ×2 IMPLANT
SPONGE SURGIFOAM ABS GEL 12-7 (HEMOSTASIS) IMPLANT
SUT CHROMIC 3 0 PS 2 (SUTURE) ×2 IMPLANT
SYR CONTROL 10ML LL (SYRINGE) ×2 IMPLANT
TRAY ENT MC OR (CUSTOM PROCEDURE TRAY) ×2 IMPLANT
TUBING IRRIGATION (MISCELLANEOUS) ×2 IMPLANT
YANKAUER SUCT BULB TIP NO VENT (SUCTIONS) ×2 IMPLANT

## 2018-09-26 NOTE — Op Note (Signed)
NAME: [name], Sheryl Tucker MEDICAL RECORD IR:[id number] ACCOUNT [id number] DATE OF BIRTH:1995/08/10 FACILITY: [facility] LOCATION: [facility] PHYSICIAN:Dereona Kolodny M. Doralyn Kirkes, DDS  OPERATIVE REPORT  DATE OF PROCEDURE:  09/26/2018  PREOPERATIVE DIAGNOSES:    1.  Impacted teeth numbers 17 and 32.  Nonrestorable teeth, numbers 1 and 16. 2.  Morbid obesity.  POSTOPERATIVE DIAGNOSIS:   1.  Impacted teeth numbers 17 and 32.  Nonrestorable teeth, numbers 1 and 16. 2.  Morbid obesity.  PROCEDURE:  Extraction teeth numbers 1, 16, 17 and 32.  SURGEON:  Diona Browner, DDS  ANESTHESIA:  General.  Dr. Tobias Alexander, attending.  Oral intubation.  DESCRIPTION OF PROCEDURE:  The patient was taken to the operating room and placed on the table in supine position.  General anesthesia was administered intravenously and an oral endotracheal tube was placed and secured.  The eyes were protected and the  patient was draped for surgery.  A timeout was performed.  The posterior pharynx was suctioned and a throat pack was placed.  Lidocaine 2%, 1:100,000 epinephrine was infiltrated in an inferior alveolar block on the right and left sides and in buccal and  palatal infiltration around teeth numbers 1 and 16.  A total of 10 mL was utilized.  A bite block was placed on the right side of the mouth and a sweetheart retractor was used to retract the tongue.  A #15 blade was used to make an incision in the  gingival sulcus around tooth #16 and the attached gingiva overlying tooth #17, which was impacted.  The periosteum was reflected.  Tooth #16 was removed using a 301 elevator and dental forceps.  Tooth #17 required elevation of a full-thickness flap and  removal of bone with the Stryker handpiece and fissure bur under irrigation.  The tooth was then elevated and removed with a 301 elevator.  The sockets were then irrigated, curetted and closed with 3-0 chromic.  Then, the endotracheal tube was  repositioned to the other side of the mouth.   The bite block and sweetheart retractor were also placed on the other side of the mouth and a 15 blade was used to make an incision around teeth numbers 1 and 32.  Tooth #32 was impacted and required  elevation of a mucoperiosteal flap and bone was removed overlying this tooth using a Stryker handpiece with fissure bur under irrigation.  The tooth was then sectioned.  A portion of the root broke off, but this was retrieved with a 301 elevator and the  tooth was removed in an uncomplicated fashion.  Then, tooth #1 was removed with a 301 elevator and the upper Universal forceps.  Then, the sockets were curetted, irrigated, and closed with 3-0 chromic.  The oral cavity was then irrigated and suctioned  and a throat pack was removed.    The patient was left in care of Anesthesia for extubation with plans for discharge through day surgery after recovery room.  ESTIMATED BLOOD LOSS:  Minimal.  COMPLICATIONS:  None.  SPECIMENS:  None.  AN/NUANCE  D:09/26/2018 T:09/26/2018 JOB:[id number]

## 2018-09-26 NOTE — Op Note (Signed)
09/26/2018  8:06 AM  PATIENT:  Sheryl Tucker  23 y.o. female  PRE-OPERATIVE DIAGNOSIS:  NONESTORABLE/ Impacted teeth 1, 16,1 7, 32, morbid obesity  POST-OPERATIVE DIAGNOSIS:  SAME  PROCEDURE:  Procedure(s): DENTAL EXTRACTIONS #1, 16,17, 32  SURGEON:  Surgeon(s): Diona Browner, DDS  ANESTHESIA:   local and general  EBL:  minimal  DRAINS: none   SPECIMEN:  No Specimen  COUNTS:  YES  PLAN OF CARE: Discharge to home after PACU  PATIENT DISPOSITION:  PACU - hemodynamically stable.   PROCEDURE [id number]  Gae Bon, DMD 09/26/2018 8:06 AM

## 2018-09-26 NOTE — Transfer of Care (Signed)
Immediate Anesthesia Transfer of Care Note  Patient: Sheryl Tucker  Procedure(s) Performed: DENTAL RESTORATION/EXTRACTIONS (Bilateral Mouth)  Patient Location: PACU  Anesthesia Type:General  Level of Consciousness: awake confused crying and very anxious  Airway & Oxygen Therapy: Patient Spontanous Breathing and Patient connected to nasal cannula oxygen  Post-op Assessment: Report given to RN and Post -op Vital signs reviewed and stable  Post vital signs: Reviewed and stable  Last Vitals:  Vitals Value Taken Time  BP    Temp    Pulse    Resp    SpO2      Last Pain:  Vitals:   09/26/18 0646  TempSrc:   PainSc: 0-No pain      Patients Stated Pain Goal: 3 (28/31/51 7616)  Complications: No apparent anesthesia complications

## 2018-09-26 NOTE — Anesthesia Postprocedure Evaluation (Signed)
Anesthesia Post Note  Patient: Sheryl Tucker  Procedure(s) Performed: DENTAL RESTORATION/EXTRACTIONS (Bilateral Mouth)     Patient location during evaluation: PACU Anesthesia Type: General Level of consciousness: sedated Pain management: pain level controlled Vital Signs Assessment: post-procedure vital signs reviewed and stable Respiratory status: spontaneous breathing and respiratory function stable Cardiovascular status: stable Postop Assessment: no apparent nausea or vomiting Anesthetic complications: no    Last Vitals:  Vitals:   09/26/18 0909 09/26/18 0924  BP: 113/67 101/71  Pulse: 73 78  Resp: 12 14  Temp:    SpO2: 96% 96%                 Olof Marcil DANIEL
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
## 2018-09-26 NOTE — Anesthesia Procedure Notes (Signed)
Procedure Name: Intubation Date/Time: 09/26/2018 7:53 AM Performed by: Shirlyn Goltz, CRNA Pre-anesthesia Checklist: Patient identified, Emergency Drugs available, Suction available and Patient being monitored Patient Re-evaluated:Patient Re-evaluated prior to induction Oxygen Delivery Method: Circle system utilized Preoxygenation: Pre-oxygenation with 100% oxygen Induction Type: IV induction Ventilation: Mask ventilation without difficulty Laryngoscope Size: Mac and 3 Grade View: Grade I Tube type: Oral Tube size: 7.0 mm Number of attempts: 1 Airway Equipment and Method: Stylet Placement Confirmation: ETT inserted through vocal cords under direct vision,  positive ETCO2 and breath sounds checked- equal and bilateral Secured at: 20 cm Tube secured with: Tape Dental Injury: Teeth and Oropharynx as per pre-operative assessment

## 2018-09-27 ENCOUNTER — Encounter (HOSPITAL_COMMUNITY): Payer: Self-pay | Admitting: Oral Surgery

## 2018-12-16 ENCOUNTER — Other Ambulatory Visit: Payer: Self-pay

## 2018-12-16 DIAGNOSIS — Z20822 Contact with and (suspected) exposure to covid-19: Secondary | ICD-10-CM

## 2018-12-18 LAB — NOVEL CORONAVIRUS, NAA: SARS-CoV-2, NAA: NOT DETECTED

## 2019-01-19 ENCOUNTER — Other Ambulatory Visit: Payer: Self-pay

## 2019-01-19 DIAGNOSIS — Z20822 Contact with and (suspected) exposure to covid-19: Secondary | ICD-10-CM

## 2019-01-21 LAB — NOVEL CORONAVIRUS, NAA: SARS-CoV-2, NAA: NOT DETECTED

## 2019-02-28 ENCOUNTER — Other Ambulatory Visit: Payer: Self-pay

## 2019-02-28 ENCOUNTER — Encounter: Payer: Self-pay | Admitting: Emergency Medicine

## 2019-02-28 ENCOUNTER — Emergency Department: Payer: Medicaid Other

## 2019-02-28 ENCOUNTER — Emergency Department
Admission: EM | Admit: 2019-02-28 | Discharge: 2019-02-28 | Disposition: A | Discharge status: Home / Self Care | Payer: Medicaid Other | Attending: Emergency Medicine | Admitting: Emergency Medicine

## 2019-02-28 DIAGNOSIS — J069 Acute upper respiratory infection, unspecified: Secondary | ICD-10-CM | POA: Diagnosis not present

## 2019-02-28 DIAGNOSIS — R05 Cough: Secondary | ICD-10-CM | POA: Diagnosis present

## 2019-02-28 DIAGNOSIS — E039 Hypothyroidism, unspecified: Secondary | ICD-10-CM | POA: Insufficient documentation

## 2019-02-28 DIAGNOSIS — Z20828 Contact with and (suspected) exposure to other viral communicable diseases: Secondary | ICD-10-CM | POA: Diagnosis not present

## 2019-02-28 MED ORDER — BENZONATATE 200 MG PO CAPS: 200.0000 mg | ORAL_CAPSULE | Freq: Three times a day (TID) | ORAL | 0 refills | Status: DC | PRN

## 2019-02-28 NOTE — ED Triage Notes (Signed)
Cough x 2 day, pain with inspiration.

## 2019-02-28 NOTE — Discharge Instructions (Signed)
Follow-up with your primary care provider if any continued problems.  Begin taking Tessalon every 8 hours as needed for cough.  Increase fluids.  Tylenol if needed for body aches or fever.  Quarantine until you have received the results of your Covid test.  If the test is positive you will need an additional 10 days.  Return to the emergency department immediately if any severe worsening of your symptoms or difficulty breathing.

## 2019-02-28 NOTE — ED Notes (Signed)
See triage note  Presents with cough for several days  States she developed pain with cough and inspiration 2 days ago  Unsure of fever  Low grade on arrival
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
## 2019-03-02 LAB — NOVEL CORONAVIRUS, NAA (HOSP ORDER, SEND-OUT TO REF LAB; TAT 18-24 HRS): SARS-CoV-2, NAA: NOT DETECTED

## 2019-04-28 ENCOUNTER — Ambulatory Visit: Payer: Medicaid Other | Attending: Internal Medicine

## 2019-04-28 DIAGNOSIS — Z20822 Contact with and (suspected) exposure to covid-19: Secondary | ICD-10-CM

## 2019-04-29 LAB — NOVEL CORONAVIRUS, NAA: SARS-CoV-2, NAA: NOT DETECTED

## 2019-07-01 ENCOUNTER — Emergency Department: Payer: Medicaid Other

## 2019-07-01 ENCOUNTER — Other Ambulatory Visit: Payer: Self-pay

## 2019-07-01 ENCOUNTER — Emergency Department
Admission: EM | Admit: 2019-07-01 | Discharge: 2019-07-01 | Disposition: A | Discharge status: Home / Self Care | Payer: Medicaid Other | Attending: Student in an Organized Health Care Education/Training Program | Admitting: Student in an Organized Health Care Education/Training Program

## 2019-07-01 DIAGNOSIS — R1032 Left lower quadrant pain: Secondary | ICD-10-CM

## 2019-07-01 DIAGNOSIS — N201 Calculus of ureter: Secondary | ICD-10-CM | POA: Diagnosis not present

## 2019-07-01 DIAGNOSIS — E039 Hypothyroidism, unspecified: Secondary | ICD-10-CM | POA: Diagnosis not present

## 2019-07-01 LAB — LIPASE, BLOOD: Lipase: 22 U/L (ref 11–51)

## 2019-07-01 LAB — CBC
HCT: 42.3 % (ref 36.0–46.0)
Hemoglobin: 13.6 g/dL (ref 12.0–15.0)
MCH: 26.2 pg (ref 26.0–34.0)
MCHC: 32.2 g/dL (ref 30.0–36.0)
MCV: 81.5 fL (ref 80.0–100.0)
Platelets: 372 10*3/uL (ref 150–400)
RBC: 5.19 MIL/uL — ABNORMAL HIGH (ref 3.87–5.11)
RDW: 14.3 % (ref 11.5–15.5)
WBC: 11 10*3/uL — ABNORMAL HIGH (ref 4.0–10.5)
nRBC: 0 % (ref 0.0–0.2)

## 2019-07-01 LAB — COMPREHENSIVE METABOLIC PANEL
ALT: 17 U/L (ref 0–44)
AST: 19 U/L (ref 15–41)
Albumin: 4.3 g/dL (ref 3.5–5.0)
Alkaline Phosphatase: 87 U/L (ref 38–126)
Anion gap: 11 (ref 5–15)
BUN: 10 mg/dL (ref 6–20)
CO2: 22 mmol/L (ref 22–32)
Calcium: 9.6 mg/dL (ref 8.9–10.3)
Chloride: 104 mmol/L (ref 98–111)
Creatinine, Ser: 0.66 mg/dL (ref 0.44–1.00)
GFR calc Af Amer: >60 mL/min (ref >60)
GFR calc non Af Amer: >60 mL/min (ref >60)
Glucose, Bld: 104 mg/dL — ABNORMAL HIGH (ref 70–99)
Potassium: 3.4 mmol/L — ABNORMAL LOW (ref 3.5–5.1)
Sodium: 137 mmol/L (ref 135–145)
Total Bilirubin: 0.7 mg/dL (ref 0.3–1.2)
Total Protein: 8.3 g/dL — ABNORMAL HIGH (ref 6.5–8.1)

## 2019-07-01 LAB — URINALYSIS, COMPLETE (UACMP) WITH MICROSCOPIC
Bilirubin Urine: NEGATIVE
Glucose, UA: NEGATIVE mg/dL
Ketones, ur: NEGATIVE mg/dL
Leukocytes,Ua: NEGATIVE
Nitrite: NEGATIVE
Protein, ur: 30 mg/dL — AB
Specific Gravity, Urine: 1.027 (ref 1.005–1.030)
pH: 5 (ref 5.0–8.0)

## 2019-07-01 LAB — POCT PREGNANCY, URINE: Preg Test, Ur: NEGATIVE

## 2019-07-01 MED ORDER — DROPERIDOL 2.5 MG/ML IJ SOLN
2.5000 mg | Freq: Once | INTRAMUSCULAR | Status: AC
  Administered 2019-07-01: 2.5 mg via INTRAVENOUS

## 2019-07-01 MED ORDER — MORPHINE SULFATE (PF) 4 MG/ML IV SOLN
4.0000 mg | INTRAVENOUS | Status: DC | PRN
  Administered 2019-07-01: 4 mg via INTRAVENOUS
  Filled 2019-07-01: qty 1

## 2019-07-01 MED ORDER — ONDANSETRON HCL 4 MG PO TABS: 4.0000 mg | ORAL_TABLET | Freq: Every day | ORAL | 0 refills | Status: DC | PRN

## 2019-07-01 MED ORDER — KETOROLAC TROMETHAMINE 30 MG/ML IJ SOLN
15.0000 mg | Freq: Once | INTRAMUSCULAR | Status: AC
  Administered 2019-07-01: 15 mg via INTRAVENOUS
  Filled 2019-07-01: qty 1

## 2019-07-01 MED ORDER — IOHEXOL 300 MG/ML  SOLN
100.0000 mL | Freq: Once | INTRAMUSCULAR | Status: AC | PRN
  Administered 2019-07-01: 100 mL via INTRAVENOUS

## 2019-07-01 MED ORDER — OXYCODONE-ACETAMINOPHEN 5-325 MG PO TABS: 1.0000 | ORAL_TABLET | ORAL | 0 refills | Status: DC | PRN

## 2019-07-01 MED ORDER — SODIUM CHLORIDE 0.9 % IV BOLUS
1000.0000 mL | Freq: Once | INTRAVENOUS | Status: AC
  Administered 2019-07-01: 1000 mL via INTRAVENOUS

## 2019-07-01 MED ORDER — PROMETHAZINE HCL 25 MG/ML IJ SOLN
12.5000 mg | Freq: Four times a day (QID) | INTRAMUSCULAR | Status: DC | PRN
  Administered 2019-07-01: 12.5 mg via INTRAVENOUS
  Filled 2019-07-01: qty 1

## 2019-07-01 NOTE — Discharge Instructions (Signed)

## 2019-07-01 NOTE — ED Notes (Signed)
To radiology via stretcher.
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
## 2019-07-01 NOTE — ED Notes (Signed)
Return from xray

## 2019-07-01 NOTE — ED Triage Notes (Signed)
First RN Note: Pt presents to ED via POV with c/o sudden onset severe LLQ abdominal pain. Pt hysterical upon arrival to ED yelling and crying intermittently and hyperventilating. Pt able to stop crying to ask questions of ER staff upon arrival. Pt states pain started suddenly approx 1 hr ago.  Pt intermittently screaming out while in the lobby. Pt also visualized ambulating without difficulty while in the lobby out of the wheelchair. Pt states walking helps with pain. Pt provided with grip socks due to ambulating around the lobby with no shoes on. This RN encouraged patient to take deep breaths, pt intermittently able to do so.

## 2019-07-01 NOTE — ED Triage Notes (Addendum)
LLQ pain that started 1 hour ago, denies NVD. Pt able to answer questions in complete sentences without difficulty. When not being asked quested pt is hyperventilating and yelling. Described as intermittent stabbing.

## 2019-07-02 ENCOUNTER — Emergency Department
Admission: EM | Admit: 2019-07-02 | Discharge: 2019-07-02 | Disposition: A | Discharge status: Home / Self Care | Payer: Medicaid Other | Attending: Emergency Medicine | Admitting: Emergency Medicine

## 2019-07-02 ENCOUNTER — Encounter: Payer: Self-pay | Admitting: *Deleted

## 2019-07-02 ENCOUNTER — Other Ambulatory Visit: Payer: Self-pay

## 2019-07-02 ENCOUNTER — Emergency Department: Payer: Medicaid Other

## 2019-07-02 DIAGNOSIS — R1110 Vomiting, unspecified: Secondary | ICD-10-CM | POA: Insufficient documentation

## 2019-07-02 DIAGNOSIS — N200 Calculus of kidney: Secondary | ICD-10-CM | POA: Diagnosis not present

## 2019-07-02 DIAGNOSIS — R1032 Left lower quadrant pain: Secondary | ICD-10-CM | POA: Diagnosis present

## 2019-07-02 LAB — BASIC METABOLIC PANEL
Anion gap: 12 (ref 5–15)
BUN: 9 mg/dL (ref 6–20)
CO2: 23 mmol/L (ref 22–32)
Calcium: 9.3 mg/dL (ref 8.9–10.3)
Chloride: 105 mmol/L (ref 98–111)
Creatinine, Ser: 0.7 mg/dL (ref 0.44–1.00)
GFR calc Af Amer: >60 mL/min (ref >60)
GFR calc non Af Amer: >60 mL/min (ref >60)
Glucose, Bld: 101 mg/dL — ABNORMAL HIGH (ref 70–99)
Potassium: 3.6 mmol/L (ref 3.5–5.1)
Sodium: 140 mmol/L (ref 135–145)

## 2019-07-02 LAB — CBC
HCT: 40.2 % (ref 36.0–46.0)
Hemoglobin: 12.9 g/dL (ref 12.0–15.0)
MCH: 26.2 pg (ref 26.0–34.0)
MCHC: 32.1 g/dL (ref 30.0–36.0)
MCV: 81.5 fL (ref 80.0–100.0)
Platelets: 354 10*3/uL (ref 150–400)
RBC: 4.93 MIL/uL (ref 3.87–5.11)
RDW: 14.3 % (ref 11.5–15.5)
WBC: 13.1 10*3/uL — ABNORMAL HIGH (ref 4.0–10.5)
nRBC: 0 % (ref 0.0–0.2)

## 2019-07-02 MED ORDER — KETOROLAC TROMETHAMINE 60 MG/2ML IM SOLN
60.0000 mg | Freq: Once | INTRAMUSCULAR | Status: AC
  Administered 2019-07-02: 60 mg via INTRAMUSCULAR
  Filled 2019-07-02: qty 2

## 2019-07-02 MED ORDER — TAMSULOSIN HCL 0.4 MG PO CAPS
0.4000 mg | ORAL_CAPSULE | Freq: Once | ORAL | Status: AC
  Administered 2019-07-02: 0.4 mg via ORAL
  Filled 2019-07-02: qty 1

## 2019-07-02 MED ORDER — KETOROLAC TROMETHAMINE 10 MG PO TABS: 10.0000 mg | ORAL_TABLET | Freq: Four times a day (QID) | ORAL | 0 refills | Status: DC | PRN

## 2019-07-02 MED ORDER — TAMSULOSIN HCL 0.4 MG PO CAPS: 0.4000 mg | ORAL_CAPSULE | Freq: Every day | ORAL | 0 refills | Status: DC

## 2019-07-02 NOTE — ED Notes (Addendum)
Pt alert and sitting calmly in bed. In NAD. Bed locked low. Rail up. Call bell within reach. See triage note. Pt denies any further s/s since yesterday; requesting pain meds. Pt denies changes in urination/BMs since yesterday. Reports continued pain which is why she states she came back to ER. GI/Abd WDL otherwise.

## 2019-07-02 NOTE — ED Triage Notes (Signed)
Pt to triage via wheelchair.  Pt has left flank pain.  Pt has kidney stones and was seen in the ER yesterday and dx.  Pt states pain is worse today.  Pt taking pain meds without relief.  Pt has n/v today.  Alert.  Pt crying in triage.
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
## 2019-10-04 ENCOUNTER — Emergency Department: Payer: Medicaid Other

## 2019-10-04 ENCOUNTER — Other Ambulatory Visit: Payer: Self-pay

## 2019-10-04 ENCOUNTER — Encounter: Payer: Self-pay | Admitting: Emergency Medicine

## 2019-10-04 ENCOUNTER — Emergency Department
Admission: EM | Admit: 2019-10-04 | Discharge: 2019-10-04 | Disposition: A | Discharge status: Home / Self Care | Payer: Medicaid Other | Attending: Emergency Medicine | Admitting: Emergency Medicine

## 2019-10-04 DIAGNOSIS — Y9389 Activity, other specified: Secondary | ICD-10-CM | POA: Diagnosis not present

## 2019-10-04 DIAGNOSIS — Z23 Encounter for immunization: Secondary | ICD-10-CM | POA: Diagnosis not present

## 2019-10-04 DIAGNOSIS — E039 Hypothyroidism, unspecified: Secondary | ICD-10-CM | POA: Diagnosis not present

## 2019-10-04 DIAGNOSIS — S61551A Open bite of right wrist, initial encounter: Secondary | ICD-10-CM | POA: Insufficient documentation

## 2019-10-04 DIAGNOSIS — Y999 Unspecified external cause status: Secondary | ICD-10-CM | POA: Diagnosis not present

## 2019-10-04 DIAGNOSIS — W540XXA Bitten by dog, initial encounter: Secondary | ICD-10-CM | POA: Insufficient documentation

## 2019-10-04 DIAGNOSIS — Y92009 Unspecified place in unspecified non-institutional (private) residence as the place of occurrence of the external cause: Secondary | ICD-10-CM | POA: Insufficient documentation

## 2019-10-04 DIAGNOSIS — Z79899 Other long term (current) drug therapy: Secondary | ICD-10-CM | POA: Diagnosis not present

## 2019-10-04 MED ORDER — TETANUS-DIPHTH-ACELL PERTUSSIS 5-2.5-18.5 LF-MCG/0.5 IM SUSP
0.5000 mL | Freq: Once | INTRAMUSCULAR | Status: AC
  Administered 2019-10-04: 0.5 mL via INTRAMUSCULAR
  Filled 2019-10-04: qty 0.5

## 2019-10-04 NOTE — ED Triage Notes (Signed)
Pt to ER states she was helping a stray dog that was stuck when it "bit" her two nights ago.  No broken skin, bruising noted, states swelling is worse and pain persists.

## 2019-10-04 NOTE — ED Notes (Addendum)
Pt was bit by stray dog x 2 days ago on rt wrist. Skin is intact, there is slight swelling in rt wrist. Pt states she has been tx at home w/ ice and ibuprofen but there has been no relief. Pt able to move fingers and wrist but states wrist hurts w/ movement.
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
## 2019-10-04 NOTE — Discharge Instructions (Signed)
I recommend that you receive the rabies vaccination.  Rabies is deadly once you start showing symptoms.  Please return to the emergency department for vaccine series if you change your mind.

## 2019-11-05 ENCOUNTER — Ambulatory Visit (HOSPITAL_COMMUNITY)
Admission: EM | Admit: 2019-11-05 | Discharge: 2019-11-05 | Disposition: A | Discharge status: Home / Self Care | Payer: Medicaid Other | Attending: Psychiatry | Admitting: Psychiatry

## 2019-11-05 ENCOUNTER — Other Ambulatory Visit: Payer: Self-pay

## 2019-11-05 DIAGNOSIS — F329 Major depressive disorder, single episode, unspecified: Secondary | ICD-10-CM | POA: Diagnosis not present

## 2019-11-05 DIAGNOSIS — F1290 Cannabis use, unspecified, uncomplicated: Secondary | ICD-10-CM | POA: Diagnosis not present

## 2019-11-05 DIAGNOSIS — R45851 Suicidal ideations: Secondary | ICD-10-CM | POA: Insufficient documentation

## 2019-11-05 DIAGNOSIS — F321 Major depressive disorder, single episode, moderate: Secondary | ICD-10-CM

## 2019-11-05 LAB — POCT PREGNANCY, URINE: Preg Test, Ur: NEGATIVE
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
## 2019-11-05 NOTE — ED Notes (Signed)
Patient belongings are in locker 31.

## 2019-11-05 NOTE — Discharge Instructions (Signed)

## 2019-11-05 NOTE — ED Provider Notes (Signed)
Behavioral Health Medical Screening Exam  Sheryl Tucker is a 24 y.o. female.  Patient presents voluntarily to [facility] for walk-in assessment directed by primary care provider.  Patient assessed by nurse practitioner.  Patient alert and oriented, answers appropriately.  Patient pleasant and cooperative for assessment.  Patient reports being seen by primary care today, completed depression screening tool and reported symptoms of depression.  Primary care provider suggested psychiatric evaluation.  Patient endorses chronic suicidal ideations, denies plan or intent..  Patient reports "I have been feeling this way for approximately 10 years, I am not going to hurt myself, I would never hurt myself."  Patient denies history of suicide attempts.  Patient denies homicidal ideations.  Patient denies auditory and visual hallucinations.  Patient denies symptoms of paranoia.  Patient does not appear to be responding to internal stimuli.  Patient resides in [place] with her mother, stepfather and a family friend.  Patient denies access to weapons.  Patient reports she is employed at a [employer].  Patient denies alcohol use.  Patient endorses daily marijuana use, denies substance use aside from marijuana.  Patient reports she uses marijuana to treat chronic foot pain, reports "marijuana helps my pain."  Patient reports she would like to see a talk therapist.  Patient has history of seeing talk therapy and reports it is helpful.  Patient is not interested in pharmacological interventions at this time, reports concern that she would become addicted to medication.  Patient reports parents have addiction history therefore she prefers not to use prescription medications.  Patient offered support and encouragement.  Total Time spent with patient: 30 minutes  Psychiatric Specialty Exam  Presentation  General Appearance:Appropriate for Environment  Eye Contact:Good  Speech:Clear and Coherent;Normal  Rate  Speech Volume:Normal  Handedness:Right   Mood and Affect  Mood:Depressed  Affect:Appropriate;Congruent   Thought Process  Thought Processes:Coherent  Descriptions of Associations:Intact  Orientation:Full (Time, Place and Person)  Thought Content:Logical;WDL  Hallucinations:None  Ideas of Reference:None  Suicidal Thoughts:Yes, Passive Without Intent;Without Plan  Homicidal Thoughts:No   Sensorium  Memory:Immediate Good;Recent Good;Remote Good  Judgment:Good  Insight:Good   Executive Functions  Concentration:Fair  Attention Span:Good  Recall:Good  Fund of Knowledge:Good  Language:Good   Psychomotor Activity  Psychomotor Activity:Normal   Assets  Assets:Communication Skills;Desire for Improvement;Financial Resources/Insurance;Intimacy;Housing;Leisure Time;Physical Health;Resilience;Social Support;Talents/Skills;Transportation;Vocational/Educational   Sleep  Sleep:Good  Number of hours: 8   Physical Exam: Physical Exam Vitals and nursing note reviewed.  Constitutional:      Appearance: She is well-developed.  HENT:     Head: Normocephalic.  Cardiovascular:     Rate and Rhythm: Normal rate.  Pulmonary:     Effort: Pulmonary effort is normal.  Neurological:     Mental Status: She is alert and oriented to person, place, and time.  Psychiatric:        Attention and Perception: Attention and perception normal.        Mood and Affect: Affect normal. Mood is depressed.        Speech: Speech normal.        Behavior: Behavior normal. Behavior is cooperative.        Thought Content: Thought content includes suicidal ideation.        Cognition and Memory: Cognition and memory normal.        Judgment: Judgment normal.    Review of Systems  Constitutional: Negative.   HENT: Negative.   Eyes: Negative.   Respiratory: Negative.   Cardiovascular: Negative.   Gastrointestinal: Negative.  Genitourinary: Negative.   Musculoskeletal:  Negative.   Skin: Negative.   Neurological: Negative.   Endo/Heme/Allergies: Negative.   Psychiatric/Behavioral: Positive for depression, substance abuse and suicidal ideas.   Blood pressure 114/62, pulse 60, temperature 98.4 F (36.9 C), temperature source Oral, resp. rate 18, height 5\' 4"  (1.626 m), weight 235 lb (106.6 kg). Body mass index is 40.34 kg/m.  Musculoskeletal: Strength & Muscle Tone: within normal limits Gait & Station: normal Patient leans: N/A   Recommendations: Patient reviewed with Dr. . Patient will follow up with [facility] outpatient with counseling.  Based on my evaluation the patient does not appear to have an emergency medical condition.  [facility], FNP 11/05/2019, 1:52 PM

## 2019-12-16 ENCOUNTER — Other Ambulatory Visit: Payer: Self-pay

## 2019-12-16 ENCOUNTER — Ambulatory Visit (INDEPENDENT_AMBULATORY_CARE_PROVIDER_SITE_OTHER): Payer: Medicaid Other | Admitting: Clinical

## 2019-12-16 DIAGNOSIS — F331 Major depressive disorder, recurrent, moderate: Secondary | ICD-10-CM | POA: Diagnosis not present
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
## 2019-12-24 ENCOUNTER — Other Ambulatory Visit: Payer: Self-pay

## 2019-12-24 ENCOUNTER — Emergency Department: Payer: Medicaid Other

## 2019-12-24 ENCOUNTER — Emergency Department
Admission: EM | Admit: 2019-12-24 | Discharge: 2019-12-24 | Disposition: A | Discharge status: Home / Self Care | Payer: Medicaid Other | Attending: Emergency Medicine | Admitting: Emergency Medicine

## 2019-12-24 ENCOUNTER — Encounter: Payer: Self-pay | Admitting: Emergency Medicine

## 2019-12-24 DIAGNOSIS — E039 Hypothyroidism, unspecified: Secondary | ICD-10-CM | POA: Diagnosis not present

## 2019-12-24 DIAGNOSIS — N200 Calculus of kidney: Secondary | ICD-10-CM | POA: Insufficient documentation

## 2019-12-24 DIAGNOSIS — Z20822 Contact with and (suspected) exposure to covid-19: Secondary | ICD-10-CM | POA: Insufficient documentation

## 2019-12-24 DIAGNOSIS — R109 Unspecified abdominal pain: Secondary | ICD-10-CM | POA: Diagnosis present

## 2019-12-24 LAB — RESPIRATORY PANEL BY RT PCR (FLU A&B, COVID)
Influenza A by PCR: NEGATIVE
Influenza B by PCR: NEGATIVE
SARS Coronavirus 2 by RT PCR: NEGATIVE

## 2019-12-24 LAB — HEPATIC FUNCTION PANEL
ALT: 18 U/L (ref 0–44)
AST: 16 U/L (ref 15–41)
Albumin: 4.3 g/dL (ref 3.5–5.0)
Alkaline Phosphatase: 80 U/L (ref 38–126)
Bilirubin, Direct: <0.1 mg/dL (ref 0.0–0.2)
Total Bilirubin: 0.6 mg/dL (ref 0.3–1.2)
Total Protein: 7.9 g/dL (ref 6.5–8.1)

## 2019-12-24 LAB — CBC
HCT: 45.5 % (ref 36.0–46.0)
Hemoglobin: 15.1 g/dL — ABNORMAL HIGH (ref 12.0–15.0)
MCH: 27.6 pg (ref 26.0–34.0)
MCHC: 33.2 g/dL (ref 30.0–36.0)
MCV: 83.2 fL (ref 80.0–100.0)
Platelets: 286 10*3/uL (ref 150–400)
RBC: 5.47 MIL/uL — ABNORMAL HIGH (ref 3.87–5.11)
RDW: 13.3 % (ref 11.5–15.5)
WBC: 8 10*3/uL (ref 4.0–10.5)
nRBC: 0 % (ref 0.0–0.2)

## 2019-12-24 LAB — BASIC METABOLIC PANEL
Anion gap: 8 (ref 5–15)
BUN: 10 mg/dL (ref 6–20)
CO2: 25 mmol/L (ref 22–32)
Calcium: 9.4 mg/dL (ref 8.9–10.3)
Chloride: 101 mmol/L (ref 98–111)
Creatinine, Ser: 0.73 mg/dL (ref 0.44–1.00)
GFR calc Af Amer: >60 mL/min (ref >60)
GFR calc non Af Amer: >60 mL/min (ref >60)
Glucose, Bld: 90 mg/dL (ref 70–99)
Potassium: 3.9 mmol/L (ref 3.5–5.1)
Sodium: 134 mmol/L — ABNORMAL LOW (ref 135–145)

## 2019-12-24 LAB — URINALYSIS, COMPLETE (UACMP) WITH MICROSCOPIC
Bacteria, UA: NONE SEEN
Bilirubin Urine: NEGATIVE
Glucose, UA: NEGATIVE mg/dL
Ketones, ur: NEGATIVE mg/dL
Leukocytes,Ua: NEGATIVE
Nitrite: NEGATIVE
Protein, ur: NEGATIVE mg/dL
Specific Gravity, Urine: 1.011 (ref 1.005–1.030)
pH: 6 (ref 5.0–8.0)

## 2019-12-24 LAB — LIPASE, BLOOD: Lipase: 23 U/L (ref 11–51)

## 2019-12-24 LAB — PREGNANCY, URINE: Preg Test, Ur: NEGATIVE

## 2019-12-24 MED ORDER — KETOROLAC TROMETHAMINE 30 MG/ML IJ SOLN
30.0000 mg | Freq: Once | INTRAMUSCULAR | Status: AC
  Administered 2019-12-24: 30 mg via INTRAVENOUS
  Filled 2019-12-24: qty 1

## 2019-12-24 MED ORDER — ACETAMINOPHEN 500 MG PO TABS
1000.0000 mg | ORAL_TABLET | Freq: Once | ORAL | Status: AC
  Administered 2019-12-24: 1000 mg via ORAL
  Filled 2019-12-24: qty 2

## 2019-12-24 NOTE — ED Triage Notes (Signed)
Pt states was seen here for flank pain and dx'd with a kidney stone on her left side a few months ago and now she is starting to have the same pain in her right flank. Denies other sx's.
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
## 2019-12-25 ENCOUNTER — Encounter (HOSPITAL_COMMUNITY): Payer: Self-pay | Admitting: Emergency Medicine

## 2019-12-25 ENCOUNTER — Emergency Department (HOSPITAL_COMMUNITY)
Admission: EM | Admit: 2019-12-25 | Discharge: 2019-12-25 | Disposition: A | Discharge status: Home / Self Care | Payer: Medicaid Other | Attending: Emergency Medicine | Admitting: Emergency Medicine

## 2019-12-25 DIAGNOSIS — E039 Hypothyroidism, unspecified: Secondary | ICD-10-CM | POA: Insufficient documentation

## 2019-12-25 DIAGNOSIS — N200 Calculus of kidney: Secondary | ICD-10-CM | POA: Insufficient documentation

## 2019-12-25 DIAGNOSIS — R1031 Right lower quadrant pain: Secondary | ICD-10-CM | POA: Diagnosis present

## 2019-12-25 LAB — URINALYSIS, ROUTINE W REFLEX MICROSCOPIC
Bilirubin Urine: NEGATIVE
Glucose, UA: NEGATIVE mg/dL
Hgb urine dipstick: NEGATIVE
Ketones, ur: NEGATIVE mg/dL
Leukocytes,Ua: NEGATIVE
Nitrite: NEGATIVE
Protein, ur: NEGATIVE mg/dL
Specific Gravity, Urine: 1.02 (ref 1.005–1.030)
pH: 5.5 (ref 5.0–8.0)

## 2019-12-25 LAB — CBC
HCT: 48.5 % — ABNORMAL HIGH (ref 36.0–46.0)
Hemoglobin: 15.3 g/dL — ABNORMAL HIGH (ref 12.0–15.0)
MCH: 26.9 pg (ref 26.0–34.0)
MCHC: 31.5 g/dL (ref 30.0–36.0)
MCV: 85.4 fL (ref 80.0–100.0)
Platelets: 338 10*3/uL (ref 150–400)
RBC: 5.68 MIL/uL — ABNORMAL HIGH (ref 3.87–5.11)
RDW: 13.3 % (ref 11.5–15.5)
WBC: 9.5 10*3/uL (ref 4.0–10.5)
nRBC: 0 % (ref 0.0–0.2)

## 2019-12-25 LAB — COMPREHENSIVE METABOLIC PANEL
ALT: 17 U/L (ref 0–44)
AST: 17 U/L (ref 15–41)
Albumin: 4 g/dL (ref 3.5–5.0)
Alkaline Phosphatase: 83 U/L (ref 38–126)
Anion gap: 11 (ref 5–15)
BUN: 12 mg/dL (ref 6–20)
CO2: 26 mmol/L (ref 22–32)
Calcium: 10.3 mg/dL (ref 8.9–10.3)
Chloride: 104 mmol/L (ref 98–111)
Creatinine, Ser: 0.82 mg/dL (ref 0.44–1.00)
GFR calc Af Amer: >60 mL/min (ref >60)
GFR calc non Af Amer: >60 mL/min (ref >60)
Glucose, Bld: 104 mg/dL — ABNORMAL HIGH (ref 70–99)
Potassium: 4.4 mmol/L (ref 3.5–5.1)
Sodium: 141 mmol/L (ref 135–145)
Total Bilirubin: 0.7 mg/dL (ref 0.3–1.2)
Total Protein: 7.4 g/dL (ref 6.5–8.1)

## 2019-12-25 LAB — WET PREP, GENITAL
Clue Cells Wet Prep HPF POC: NONE SEEN
Sperm: NONE SEEN
Trich, Wet Prep: NONE SEEN
Yeast Wet Prep HPF POC: NONE SEEN

## 2019-12-25 LAB — I-STAT BETA HCG BLOOD, ED (MC, WL, AP ONLY): I-stat hCG, quantitative: <5 m[IU]/mL (ref <5)

## 2019-12-25 LAB — LIPASE, BLOOD: Lipase: 25 U/L (ref 11–51)

## 2019-12-25 MED ORDER — ONDANSETRON 4 MG PO TBDP
4.0000 mg | ORAL_TABLET | Freq: Once | ORAL | Status: AC
  Administered 2019-12-25: 4 mg via ORAL
  Filled 2019-12-25: qty 1

## 2019-12-25 MED ORDER — ONDANSETRON HCL 4 MG PO TABS: 4.0000 mg | ORAL_TABLET | Freq: Four times a day (QID) | ORAL | 0 refills | Status: DC

## 2019-12-25 MED ORDER — KETOROLAC TROMETHAMINE 60 MG/2ML IM SOLN
60.0000 mg | Freq: Once | INTRAMUSCULAR | Status: AC
  Administered 2019-12-25: 60 mg via INTRAMUSCULAR
  Filled 2019-12-25: qty 2
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
## 2019-12-25 NOTE — ED Triage Notes (Signed)
Pt states she was diagnosed with renal calculi on R side a few months ago, c/o continued R flank and R abd pain last night with diarrhea as well. Seen at [facility] yesterday for the same. States she was not discharged with any pain meds. Reports urinary frequency.

## 2019-12-25 NOTE — ED Notes (Signed)
Patient verbalizes understanding of discharge instructions. Opportunity for questioning and answers were provided. Armband removed by staff, pt discharged from ED and ambulated to lobby to return home with friend.  ?

## 2019-12-25 NOTE — Discharge Instructions (Signed)
Thank you for letting me take care of you in the ER today.  Your work-up today was overall reassuring.  Please take the nausea medication as needed for nausea.  As discussed, please take 800 mg of ibuprofen up to 3 times a day as needed for pain.  Please make sure to follow-up with your primary care doctor and the kidney doctors that you were referred to.  Return to the ER for any new or worsening symptoms.

## 2019-12-28 LAB — GC/CHLAMYDIA PROBE AMP (~~LOC~~) NOT AT ARMC
Chlamydia: NEGATIVE
Comment: NEGATIVE
Comment: NORMAL
Neisseria Gonorrhea: NEGATIVE

## 2020-01-18 IMAGING — DX DG CHEST 1V PORT
1 series · 1 of 1 positions shown · non-contrast
Comparison: March 02, 2018

CLINICAL DATA: Fever and cough

EXAM:
PORTABLE CHEST 1 VIEW

[chest ap]
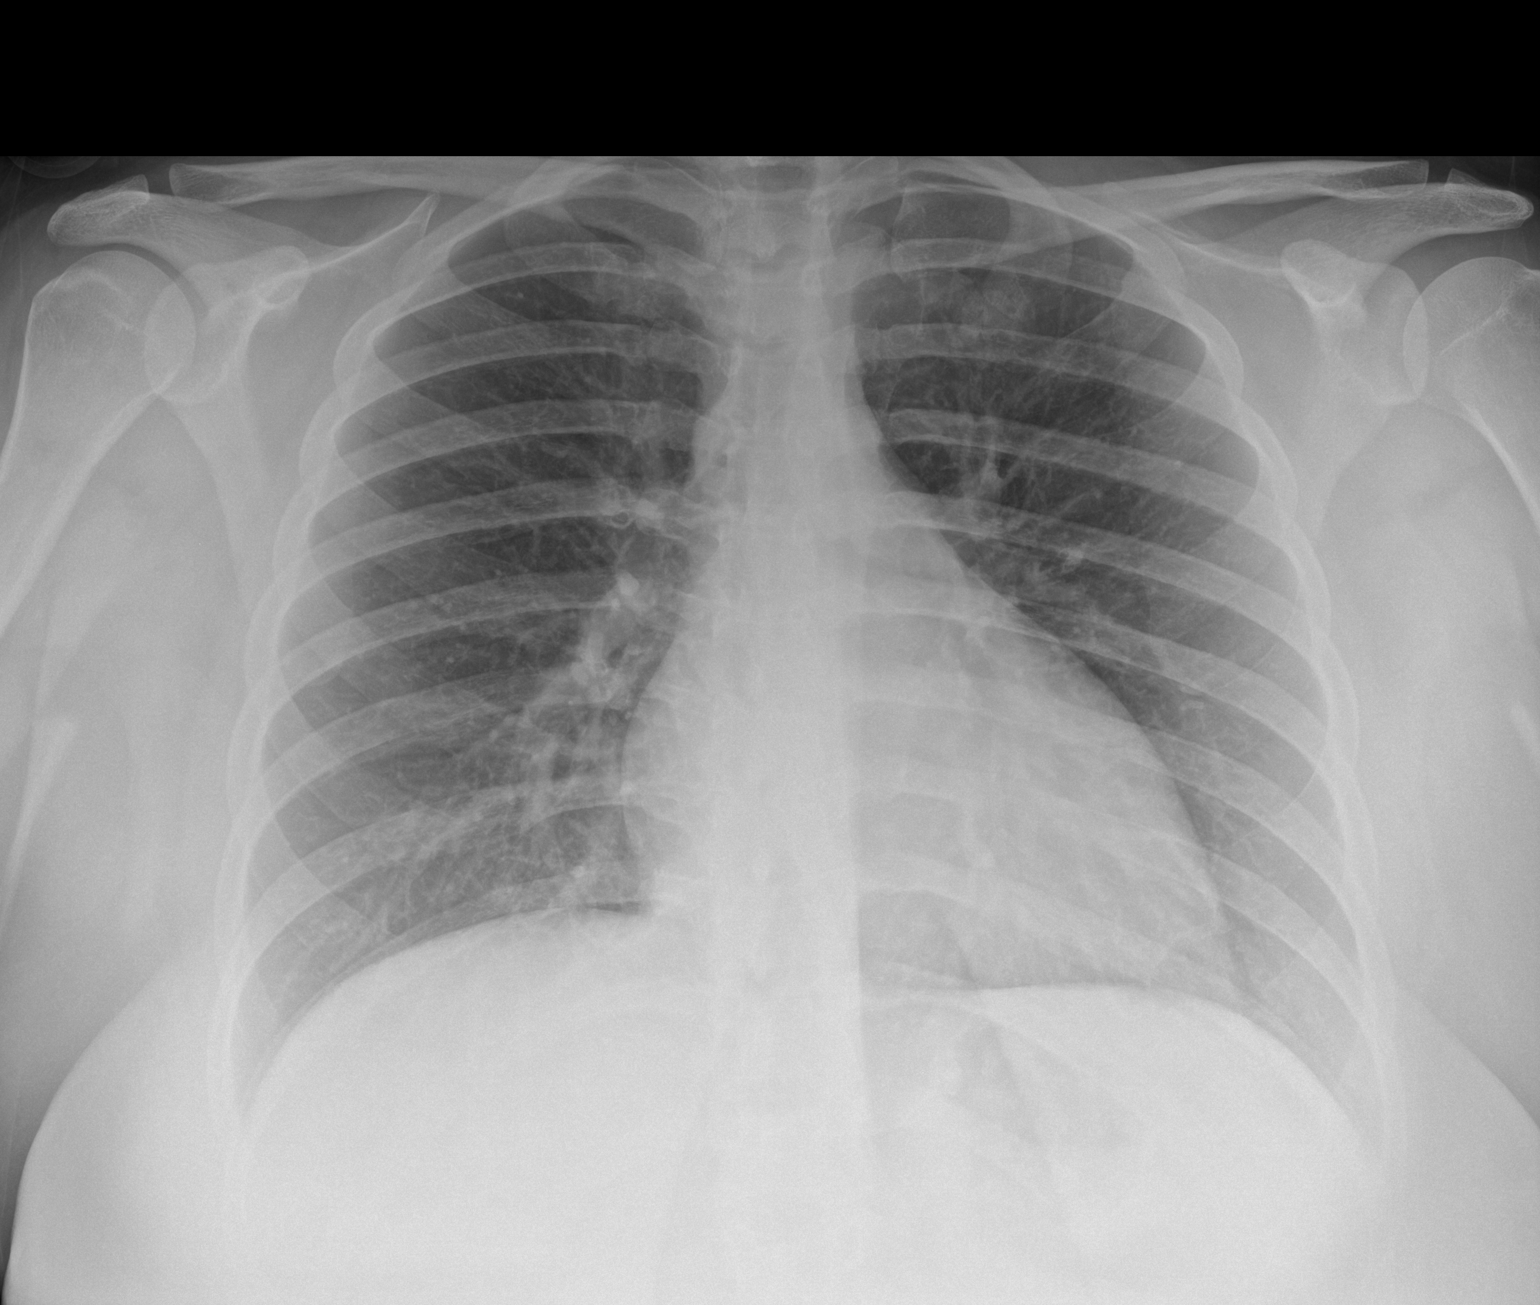

[1 of 1 positions shown; findings below may reference images not displayed]

FINDINGS: Lungs are clear. Heart size and pulmonary vascularity are normal. No
adenopathy. No bone lesions.
IMPRESSION: No edema or consolidation.

## 2020-02-04 ENCOUNTER — Other Ambulatory Visit: Payer: Self-pay

## 2020-02-04 ENCOUNTER — Telehealth (INDEPENDENT_AMBULATORY_CARE_PROVIDER_SITE_OTHER): Payer: Medicaid Other | Admitting: Psychiatry

## 2020-02-04 ENCOUNTER — Encounter (HOSPITAL_COMMUNITY): Payer: Self-pay | Admitting: Psychiatry

## 2020-02-04 DIAGNOSIS — F411 Generalized anxiety disorder: Secondary | ICD-10-CM

## 2020-02-04 DIAGNOSIS — F332 Major depressive disorder, recurrent severe without psychotic features: Secondary | ICD-10-CM

## 2020-02-04 MED ORDER — ESCITALOPRAM OXALATE 10 MG PO TABS: 10.0000 mg | ORAL_TABLET | Freq: Every day | ORAL | 2 refills | Status: DC

## 2020-02-04 MED ORDER — HYDROXYZINE HCL 10 MG PO TABS: 10.0000 mg | ORAL_TABLET | Freq: Three times a day (TID) | ORAL | 2 refills | Status: DC | PRN
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
## 2020-02-18 ENCOUNTER — Other Ambulatory Visit: Payer: Self-pay

## 2020-02-18 ENCOUNTER — Encounter: Payer: Self-pay | Admitting: Emergency Medicine

## 2020-02-18 ENCOUNTER — Inpatient Hospital Stay
Admission: EM | Admit: 2020-02-18 | Discharge: 2020-02-20 | DRG: 661 | Disposition: A | Discharge status: Home / Self Care | Payer: Medicaid Other | Attending: Internal Medicine | Admitting: Internal Medicine

## 2020-02-18 ENCOUNTER — Emergency Department: Payer: Medicaid Other

## 2020-02-18 DIAGNOSIS — F4310 Post-traumatic stress disorder, unspecified: Secondary | ICD-10-CM | POA: Diagnosis present

## 2020-02-18 DIAGNOSIS — J302 Other seasonal allergic rhinitis: Secondary | ICD-10-CM | POA: Diagnosis present

## 2020-02-18 DIAGNOSIS — E039 Hypothyroidism, unspecified: Secondary | ICD-10-CM | POA: Diagnosis present

## 2020-02-18 DIAGNOSIS — M940 Chondrocostal junction syndrome [Tietze]: Secondary | ICD-10-CM | POA: Diagnosis present

## 2020-02-18 DIAGNOSIS — N200 Calculus of kidney: Secondary | ICD-10-CM

## 2020-02-18 DIAGNOSIS — N132 Hydronephrosis with renal and ureteral calculous obstruction: Principal | ICD-10-CM | POA: Diagnosis present

## 2020-02-18 DIAGNOSIS — F32A Depression, unspecified: Secondary | ICD-10-CM | POA: Diagnosis present

## 2020-02-18 DIAGNOSIS — R112 Nausea with vomiting, unspecified: Secondary | ICD-10-CM | POA: Diagnosis present

## 2020-02-18 DIAGNOSIS — Z79899 Other long term (current) drug therapy: Secondary | ICD-10-CM

## 2020-02-18 DIAGNOSIS — Z20822 Contact with and (suspected) exposure to covid-19: Secondary | ICD-10-CM | POA: Diagnosis present

## 2020-02-18 DIAGNOSIS — Z885 Allergy status to narcotic agent status: Secondary | ICD-10-CM

## 2020-02-18 LAB — URINALYSIS, COMPLETE (UACMP) WITH MICROSCOPIC
Bacteria, UA: NONE SEEN
Bilirubin Urine: NEGATIVE
Glucose, UA: NEGATIVE mg/dL
Ketones, ur: 80 mg/dL — AB
Leukocytes,Ua: NEGATIVE
Nitrite: NEGATIVE
Protein, ur: NEGATIVE mg/dL
RBC / HPF: >50 RBC/hpf — ABNORMAL HIGH (ref 0–5)
Specific Gravity, Urine: 1.02 (ref 1.005–1.030)
pH: 5 (ref 5.0–8.0)

## 2020-02-18 LAB — BASIC METABOLIC PANEL
Anion gap: 15 (ref 5–15)
BUN: 9 mg/dL (ref 6–20)
CO2: 21 mmol/L — ABNORMAL LOW (ref 22–32)
Calcium: 9.7 mg/dL (ref 8.9–10.3)
Chloride: 101 mmol/L (ref 98–111)
Creatinine, Ser: 0.72 mg/dL (ref 0.44–1.00)
GFR, Estimated: >60 mL/min (ref >60)
Glucose, Bld: 86 mg/dL (ref 70–99)
Potassium: 3.7 mmol/L (ref 3.5–5.1)
Sodium: 137 mmol/L (ref 135–145)

## 2020-02-18 LAB — CBC
HCT: 44.4 % (ref 36.0–46.0)
Hemoglobin: 14.9 g/dL (ref 12.0–15.0)
MCH: 27.6 pg (ref 26.0–34.0)
MCHC: 33.6 g/dL (ref 30.0–36.0)
MCV: 82.4 fL (ref 80.0–100.0)
Platelets: 366 10*3/uL (ref 150–400)
RBC: 5.39 MIL/uL — ABNORMAL HIGH (ref 3.87–5.11)
RDW: 13 % (ref 11.5–15.5)
WBC: 10.6 10*3/uL — ABNORMAL HIGH (ref 4.0–10.5)
nRBC: 0 % (ref 0.0–0.2)

## 2020-02-18 LAB — POC URINE PREG, ED: Preg Test, Ur: NEGATIVE

## 2020-02-18 MED ORDER — PROMETHAZINE HCL 25 MG PO TABS: 25.0000 mg | ORAL_TABLET | Freq: Three times a day (TID) | ORAL | 0 refills | Status: DC | PRN

## 2020-02-18 MED ORDER — HYDROCODONE-ACETAMINOPHEN 5-325 MG PO TABS
2.0000 | ORAL_TABLET | Freq: Once | ORAL | Status: AC
  Administered 2020-02-19: 2 via ORAL
  Filled 2020-02-18: qty 2

## 2020-02-18 MED ORDER — TAMSULOSIN HCL 0.4 MG PO CAPS
0.4000 mg | ORAL_CAPSULE | Freq: Once | ORAL | Status: AC
  Administered 2020-02-18: 0.4 mg via ORAL
  Filled 2020-02-18: qty 1

## 2020-02-18 MED ORDER — ONDANSETRON HCL 4 MG/2ML IJ SOLN
4.0000 mg | Freq: Once | INTRAMUSCULAR | Status: AC
  Administered 2020-02-18: 4 mg via INTRAVENOUS
  Filled 2020-02-18: qty 2

## 2020-02-18 MED ORDER — KETOROLAC TROMETHAMINE 30 MG/ML IJ SOLN
15.0000 mg | Freq: Once | INTRAMUSCULAR | Status: AC
  Administered 2020-02-18: 15 mg via INTRAVENOUS
  Filled 2020-02-18: qty 1

## 2020-02-18 MED ORDER — HYDROMORPHONE HCL 1 MG/ML IJ SOLN
1.0000 mg | Freq: Once | INTRAMUSCULAR | Status: AC
  Administered 2020-02-18: 1 mg via INTRAVENOUS
  Filled 2020-02-18: qty 1

## 2020-02-18 MED ORDER — SODIUM CHLORIDE 0.9 % IV BOLUS
1000.0000 mL | Freq: Once | INTRAVENOUS | Status: AC
  Administered 2020-02-18: 1000 mL via INTRAVENOUS

## 2020-02-18 NOTE — ED Triage Notes (Signed)
Pt arrived via POV with reports of R side flank pain radiating to R side of abdomen.  Pt states the pain started a few hours ago.  Pt had recent ankle surgery and is on pain meds and antibiotics.  Hx of kidney stones.
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
## 2020-02-18 NOTE — ED Notes (Signed)
ED Provider at bedside. 

## 2020-02-19 ENCOUNTER — Inpatient Hospital Stay: Payer: Medicaid Other | Admitting: Certified Registered Nurse Anesthetist

## 2020-02-19 ENCOUNTER — Encounter
Admission: EM | Disposition: A | Discharge status: Home / Self Care | Payer: Self-pay | Source: Home / Self Care | Attending: Internal Medicine

## 2020-02-19 ENCOUNTER — Inpatient Hospital Stay: Payer: Medicaid Other

## 2020-02-19 ENCOUNTER — Encounter: Payer: Self-pay | Admitting: Family Medicine

## 2020-02-19 DIAGNOSIS — N200 Calculus of kidney: Secondary | ICD-10-CM | POA: Diagnosis present

## 2020-02-19 DIAGNOSIS — E039 Hypothyroidism, unspecified: Secondary | ICD-10-CM | POA: Diagnosis present

## 2020-02-19 DIAGNOSIS — F4310 Post-traumatic stress disorder, unspecified: Secondary | ICD-10-CM | POA: Diagnosis present

## 2020-02-19 DIAGNOSIS — R112 Nausea with vomiting, unspecified: Secondary | ICD-10-CM | POA: Diagnosis present

## 2020-02-19 DIAGNOSIS — N132 Hydronephrosis with renal and ureteral calculous obstruction: Secondary | ICD-10-CM | POA: Diagnosis present

## 2020-02-19 DIAGNOSIS — J302 Other seasonal allergic rhinitis: Secondary | ICD-10-CM | POA: Diagnosis present

## 2020-02-19 DIAGNOSIS — Z20822 Contact with and (suspected) exposure to covid-19: Secondary | ICD-10-CM | POA: Diagnosis present

## 2020-02-19 DIAGNOSIS — N139 Obstructive and reflux uropathy, unspecified: Secondary | ICD-10-CM | POA: Diagnosis not present

## 2020-02-19 DIAGNOSIS — N201 Calculus of ureter: Secondary | ICD-10-CM | POA: Diagnosis not present

## 2020-02-19 DIAGNOSIS — F32A Depression, unspecified: Secondary | ICD-10-CM | POA: Diagnosis present

## 2020-02-19 DIAGNOSIS — Z79899 Other long term (current) drug therapy: Secondary | ICD-10-CM | POA: Diagnosis not present

## 2020-02-19 DIAGNOSIS — Z885 Allergy status to narcotic agent status: Secondary | ICD-10-CM | POA: Diagnosis not present

## 2020-02-19 DIAGNOSIS — M940 Chondrocostal junction syndrome [Tietze]: Secondary | ICD-10-CM | POA: Diagnosis present

## 2020-02-19 HISTORY — PX: URETEROSCOPY WITH HOLMIUM LASER LITHOTRIPSY: SHX6645

## 2020-02-19 HISTORY — PX: CYSTOSCOPY WITH STENT PLACEMENT: SHX5790

## 2020-02-19 LAB — CBC
HCT: 44.5 % (ref 36.0–46.0)
Hemoglobin: 14.5 g/dL (ref 12.0–15.0)
MCH: 27.7 pg (ref 26.0–34.0)
MCHC: 32.6 g/dL (ref 30.0–36.0)
MCV: 85.1 fL (ref 80.0–100.0)
Platelets: 303 10*3/uL (ref 150–400)
RBC: 5.23 MIL/uL — ABNORMAL HIGH (ref 3.87–5.11)
RDW: 12.9 % (ref 11.5–15.5)
WBC: 16.2 10*3/uL — ABNORMAL HIGH (ref 4.0–10.5)
nRBC: 0 % (ref 0.0–0.2)

## 2020-02-19 LAB — BASIC METABOLIC PANEL
Anion gap: 11 (ref 5–15)
BUN: 11 mg/dL (ref 6–20)
CO2: 21 mmol/L — ABNORMAL LOW (ref 22–32)
Calcium: 8.8 mg/dL — ABNORMAL LOW (ref 8.9–10.3)
Chloride: 107 mmol/L (ref 98–111)
Creatinine, Ser: 0.81 mg/dL (ref 0.44–1.00)
GFR, Estimated: >60 mL/min (ref >60)
Glucose, Bld: 88 mg/dL (ref 70–99)
Potassium: 3.9 mmol/L (ref 3.5–5.1)
Sodium: 139 mmol/L (ref 135–145)

## 2020-02-19 LAB — RESP PANEL BY RT-PCR (FLU A&B, COVID) ARPGX2
Influenza A by PCR: NEGATIVE
Influenza B by PCR: NEGATIVE
SARS Coronavirus 2 by RT PCR: NEGATIVE

## 2020-02-19 LAB — HIV ANTIBODY (ROUTINE TESTING W REFLEX): HIV Screen 4th Generation wRfx: NONREACTIVE

## 2020-02-19 SURGERY — URETEROSCOPY, WITH LITHOTRIPSY USING HOLMIUM LASER
Anesthesia: General | Laterality: Right

## 2020-02-19 MED ORDER — ESCITALOPRAM OXALATE 10 MG PO TABS
10.0000 mg | ORAL_TABLET | Freq: Every day | ORAL | Status: DC
  Administered 2020-02-20: 10:00:00 10 mg via ORAL
  Filled 2020-02-19 (×2): qty 1

## 2020-02-19 MED ORDER — ACETAMINOPHEN 650 MG RE SUPP: 650.0000 mg | Freq: Four times a day (QID) | RECTAL | Status: DC | PRN

## 2020-02-19 MED ORDER — SODIUM CHLORIDE (PF) 0.9 % IJ SOLN
INTRAMUSCULAR | Status: AC
  Filled 2020-02-19: qty 20

## 2020-02-19 MED ORDER — CEPHALEXIN 500 MG PO CAPS
500.0000 mg | ORAL_CAPSULE | Freq: Three times a day (TID) | ORAL | Status: DC
  Administered 2020-02-19 – 2020-02-20 (×2): 500 mg via ORAL
  Filled 2020-02-19 (×2): qty 1

## 2020-02-19 MED ORDER — MIDAZOLAM HCL 2 MG/2ML IJ SOLN
INTRAMUSCULAR | Status: DC | PRN
  Administered 2020-02-19: 2 mg via INTRAVENOUS

## 2020-02-19 MED ORDER — IOTHALAMATE MEGLUMINE 43 % IV SOLN
INTRAVENOUS | Status: DC | PRN
  Administered 2020-02-19: 20 mL

## 2020-02-19 MED ORDER — BELLADONNA ALKALOIDS-OPIUM 16.2-60 MG RE SUPP
RECTAL | Status: DC | PRN
  Administered 2020-02-19: 1 via RECTAL

## 2020-02-19 MED ORDER — BELLADONNA ALKALOIDS-OPIUM 16.2-60 MG RE SUPP
RECTAL | Status: AC
  Filled 2020-02-19: qty 1

## 2020-02-19 MED ORDER — PROPOFOL 10 MG/ML IV BOLUS
INTRAVENOUS | Status: DC | PRN
  Administered 2020-02-19: 200 mg via INTRAVENOUS

## 2020-02-19 MED ORDER — DOCUSATE SODIUM 50 MG/5ML PO LIQD
50.0000 mg | Freq: Two times a day (BID) | ORAL | Status: DC
  Administered 2020-02-19 – 2020-02-20 (×2): 50 mg via ORAL
  Filled 2020-02-19 (×5): qty 10

## 2020-02-19 MED ORDER — KETOROLAC TROMETHAMINE 30 MG/ML IJ SOLN
INTRAMUSCULAR | Status: AC
  Filled 2020-02-19: qty 1

## 2020-02-19 MED ORDER — ONDANSETRON HCL 4 MG PO TABS
4.0000 mg | ORAL_TABLET | Freq: Four times a day (QID) | ORAL | Status: DC | PRN
  Filled 2020-02-19: qty 1

## 2020-02-19 MED ORDER — MIDAZOLAM HCL 2 MG/2ML IJ SOLN
INTRAMUSCULAR | Status: AC
  Filled 2020-02-19: qty 2

## 2020-02-19 MED ORDER — HYDROMORPHONE HCL 1 MG/ML IJ SOLN
1.0000 mg | INTRAMUSCULAR | Status: DC | PRN
  Administered 2020-02-19: 1 mg via INTRAVENOUS
  Filled 2020-02-19: qty 1

## 2020-02-19 MED ORDER — FENTANYL CITRATE (PF) 100 MCG/2ML IJ SOLN: 25.0000 ug | INTRAMUSCULAR | Status: DC | PRN

## 2020-02-19 MED ORDER — FENTANYL CITRATE (PF) 100 MCG/2ML IJ SOLN
INTRAMUSCULAR | Status: AC
  Filled 2020-02-19: qty 2

## 2020-02-19 MED ORDER — CELECOXIB 200 MG PO CAPS
200.0000 mg | ORAL_CAPSULE | Freq: Every day | ORAL | Status: DC
  Administered 2020-02-20: 200 mg via ORAL
  Filled 2020-02-19 (×2): qty 1

## 2020-02-19 MED ORDER — LIDOCAINE HCL (CARDIAC) PF 100 MG/5ML IV SOSY
PREFILLED_SYRINGE | INTRAVENOUS | Status: DC | PRN
  Administered 2020-02-19: 100 mg via INTRAVENOUS

## 2020-02-19 MED ORDER — CEFAZOLIN SODIUM 1 G IJ SOLR
INTRAMUSCULAR | Status: AC
  Filled 2020-02-19: qty 20

## 2020-02-19 MED ORDER — KETOROLAC TROMETHAMINE 30 MG/ML IJ SOLN
15.0000 mg | Freq: Four times a day (QID) | INTRAMUSCULAR | Status: DC | PRN
  Administered 2020-02-19 – 2020-02-20 (×2): 15 mg via INTRAVENOUS
  Filled 2020-02-19 (×2): qty 1

## 2020-02-19 MED ORDER — DEXMEDETOMIDINE (PRECEDEX) IN NS 20 MCG/5ML (4 MCG/ML) IV SYRINGE
PREFILLED_SYRINGE | INTRAVENOUS | Status: AC
  Filled 2020-02-19: qty 5

## 2020-02-19 MED ORDER — PROMETHAZINE HCL 25 MG/ML IJ SOLN: 6.2500 mg | INTRAMUSCULAR | Status: DC | PRN

## 2020-02-19 MED ORDER — ROCURONIUM BROMIDE 10 MG/ML (PF) SYRINGE
PREFILLED_SYRINGE | INTRAVENOUS | Status: AC
  Filled 2020-02-19: qty 10

## 2020-02-19 MED ORDER — FENTANYL CITRATE (PF) 100 MCG/2ML IJ SOLN
INTRAMUSCULAR | Status: DC | PRN
  Administered 2020-02-19 (×2): 50 ug via INTRAVENOUS

## 2020-02-19 MED ORDER — HYDROMORPHONE HCL 1 MG/ML IJ SOLN: 1.0000 mg | Freq: Four times a day (QID) | INTRAMUSCULAR | Status: DC | PRN

## 2020-02-19 MED ORDER — MAGNESIUM HYDROXIDE 400 MG/5ML PO SUSP
30.0000 mL | Freq: Every day | ORAL | Status: DC | PRN
  Filled 2020-02-19: qty 30

## 2020-02-19 MED ORDER — HYDROXYZINE HCL 10 MG PO TABS
10.0000 mg | ORAL_TABLET | Freq: Three times a day (TID) | ORAL | Status: DC | PRN
  Administered 2020-02-20: 10:00:00 10 mg via ORAL
  Filled 2020-02-19 (×2): qty 1

## 2020-02-19 MED ORDER — HYDROCODONE-ACETAMINOPHEN 5-325 MG PO TABS
1.0000 | ORAL_TABLET | Freq: Four times a day (QID) | ORAL | Status: DC | PRN
  Administered 2020-02-19 – 2020-02-20 (×2): 1 via ORAL
  Filled 2020-02-19 (×2): qty 1

## 2020-02-19 MED ORDER — SULFAMETHOXAZOLE-TRIMETHOPRIM 800-160 MG PO TABS: 1.0000 | ORAL_TABLET | Freq: Every day | ORAL | Status: DC

## 2020-02-19 MED ORDER — DEXMEDETOMIDINE HCL 200 MCG/2ML IV SOLN
INTRAVENOUS | Status: DC | PRN
  Administered 2020-02-19: 8 ug via INTRAVENOUS
  Administered 2020-02-19: 4 ug via INTRAVENOUS

## 2020-02-19 MED ORDER — ENOXAPARIN SODIUM 60 MG/0.6ML ~~LOC~~ SOLN: 50.0000 mg | SUBCUTANEOUS | Status: DC

## 2020-02-19 MED ORDER — SUCCINYLCHOLINE CHLORIDE 20 MG/ML IJ SOLN
INTRAMUSCULAR | Status: DC | PRN
  Administered 2020-02-19: 100 mg via INTRAVENOUS

## 2020-02-19 MED ORDER — ROCURONIUM BROMIDE 100 MG/10ML IV SOLN
INTRAVENOUS | Status: DC | PRN
  Administered 2020-02-19: 30 mg via INTRAVENOUS

## 2020-02-19 MED ORDER — TRAMADOL HCL 50 MG PO TABS
50.0000 mg | ORAL_TABLET | Freq: Four times a day (QID) | ORAL | Status: DC | PRN
  Administered 2020-02-19: 50 mg via ORAL
  Filled 2020-02-19: qty 1

## 2020-02-19 MED ORDER — ONDANSETRON HCL 4 MG/2ML IJ SOLN
INTRAMUSCULAR | Status: AC
  Filled 2020-02-19: qty 2

## 2020-02-19 MED ORDER — ONDANSETRON 4 MG PO TBDP: 4.0000 mg | ORAL_TABLET | Freq: Three times a day (TID) | ORAL | Status: DC | PRN

## 2020-02-19 MED ORDER — ONDANSETRON HCL 4 MG/2ML IJ SOLN
4.0000 mg | Freq: Four times a day (QID) | INTRAMUSCULAR | Status: DC | PRN
  Administered 2020-02-19 – 2020-02-20 (×4): 4 mg via INTRAVENOUS
  Filled 2020-02-19 (×3): qty 2

## 2020-02-19 MED ORDER — DEXAMETHASONE SODIUM PHOSPHATE 10 MG/ML IJ SOLN
INTRAMUSCULAR | Status: AC
  Filled 2020-02-19: qty 1

## 2020-02-19 MED ORDER — TAMSULOSIN HCL 0.4 MG PO CAPS
0.4000 mg | ORAL_CAPSULE | Freq: Every day | ORAL | Status: DC
  Administered 2020-02-19: 18:00:00 0.4 mg via ORAL
  Filled 2020-02-19: qty 1

## 2020-02-19 MED ORDER — TRAZODONE HCL 50 MG PO TABS
25.0000 mg | ORAL_TABLET | Freq: Every evening | ORAL | Status: DC | PRN
  Administered 2020-02-19: 21:00:00 25 mg via ORAL
  Filled 2020-02-19: qty 1

## 2020-02-19 MED ORDER — ONDANSETRON HCL 4 MG/2ML IJ SOLN
4.0000 mg | Freq: Once | INTRAMUSCULAR | Status: AC
  Administered 2020-02-19: 4 mg via INTRAVENOUS
  Filled 2020-02-19: qty 2

## 2020-02-19 MED ORDER — HYDROMORPHONE HCL 1 MG/ML IJ SOLN
1.0000 mg | INTRAMUSCULAR | Status: DC | PRN
  Administered 2020-02-19: 10:00:00 1 mg via INTRAVENOUS
  Filled 2020-02-19: qty 1

## 2020-02-19 MED ORDER — DEXAMETHASONE SODIUM PHOSPHATE 10 MG/ML IJ SOLN
INTRAMUSCULAR | Status: DC | PRN
  Administered 2020-02-19: 10 mg via INTRAVENOUS

## 2020-02-19 MED ORDER — SUGAMMADEX SODIUM 200 MG/2ML IV SOLN
INTRAVENOUS | Status: DC | PRN
  Administered 2020-02-19: 194.2 mg via INTRAVENOUS

## 2020-02-19 MED ORDER — HYDROMORPHONE HCL 1 MG/ML IJ SOLN
1.0000 mg | INTRAMUSCULAR | Status: AC
  Administered 2020-02-19: 1 mg via INTRAVENOUS
  Filled 2020-02-19: qty 1

## 2020-02-19 MED ORDER — KETOROLAC TROMETHAMINE 30 MG/ML IJ SOLN
INTRAMUSCULAR | Status: DC | PRN
  Administered 2020-02-19: 30 mg via INTRAVENOUS

## 2020-02-19 MED ORDER — OXYBUTYNIN CHLORIDE ER 10 MG PO TB24
10.0000 mg | ORAL_TABLET | Freq: Every day | ORAL | Status: DC
  Administered 2020-02-19 – 2020-02-20 (×2): 10 mg via ORAL
  Filled 2020-02-19 (×2): qty 1

## 2020-02-19 MED ORDER — CEFAZOLIN SODIUM-DEXTROSE 2-3 GM-%(50ML) IV SOLR
INTRAVENOUS | Status: DC | PRN
  Administered 2020-02-19: 2 g via INTRAVENOUS

## 2020-02-19 MED ORDER — PROMETHAZINE HCL 25 MG/ML IJ SOLN
25.0000 mg | Freq: Once | INTRAMUSCULAR | Status: AC
  Administered 2020-02-19: 25 mg via INTRAMUSCULAR
  Filled 2020-02-19: qty 1

## 2020-02-19 MED ORDER — MEPERIDINE HCL 50 MG/ML IJ SOLN: 6.2500 mg | INTRAMUSCULAR | Status: DC | PRN

## 2020-02-19 MED ORDER — ACETAMINOPHEN 325 MG PO TABS: 650.0000 mg | ORAL_TABLET | Freq: Four times a day (QID) | ORAL | Status: DC | PRN

## 2020-02-19 MED ORDER — SODIUM CHLORIDE 0.9 % IV SOLN: INTRAVENOUS | Status: DC

## 2020-02-19 SURGICAL SUPPLY — 33 items
BAG DRAIN CYSTO-URO LG1000N (MISCELLANEOUS) IMPLANT
BASKET ZERO TIP 1.9FR (BASKET) IMPLANT
BRUSH SCRUB EZ 1% IODOPHOR (MISCELLANEOUS) ×3 IMPLANT
BULB IRRIG PATHFIND (MISCELLANEOUS) IMPLANT
CATH URETL 5X70 OPEN END (CATHETERS) ×3 IMPLANT
CNTNR SPEC 2.5X3XGRAD LEK (MISCELLANEOUS) ×2
CONRAY 43 FOR UROLOGY 50M (MISCELLANEOUS) ×3 IMPLANT
CONT SPEC 4OZ STER OR WHT (MISCELLANEOUS) ×1
CONTAINER SPEC 2.5X3XGRAD LEK (MISCELLANEOUS) ×2 IMPLANT
DRAPE UTILITY 15X26 TOWEL STRL (DRAPES) ×3 IMPLANT
FIBER LASER TRAC TIP (UROLOGICAL SUPPLIES) ×3 IMPLANT
GLOVE BIOGEL PI IND STRL 7.5 (GLOVE) ×2 IMPLANT
GLOVE BIOGEL PI INDICATOR 7.5 (GLOVE) ×1
GOWN STRL REUS W/ TWL LRG LVL3 (GOWN DISPOSABLE) ×2 IMPLANT
GOWN STRL REUS W/ TWL XL LVL3 (GOWN DISPOSABLE) ×2 IMPLANT
GOWN STRL REUS W/TWL LRG LVL3 (GOWN DISPOSABLE) ×1
GOWN STRL REUS W/TWL XL LVL3 (GOWN DISPOSABLE) ×1
GUIDEWIRE GREEN .038 145CM (MISCELLANEOUS) IMPLANT
GUIDEWIRE STR DUAL SENSOR (WIRE) ×3 IMPLANT
INFUSOR MANOMETER BAG 3000ML (MISCELLANEOUS) ×3 IMPLANT
INTRODUCER DILATOR DOUBLE (INTRODUCER) IMPLANT
KIT TURNOVER CYSTO (KITS) ×3 IMPLANT
MANIFOLD NEPTUNE II (INSTRUMENTS) IMPLANT
PACK CYSTO AR (MISCELLANEOUS) ×3 IMPLANT
SET CYSTO W/LG BORE CLAMP LF (SET/KITS/TRAYS/PACK) ×3 IMPLANT
SHEATH URETERAL 12FRX35CM (MISCELLANEOUS) IMPLANT
SOL .9 NS 3000ML IRR  AL (IV SOLUTION) ×1
SOL .9 NS 3000ML IRR UROMATIC (IV SOLUTION) ×2 IMPLANT
STENT URET 6FRX24 CONTOUR (STENTS) ×3 IMPLANT
SURGILUBE 2OZ TUBE FLIPTOP (MISCELLANEOUS) ×3 IMPLANT
TUBING ART PRESS 48 MALE/FEM (TUBING) IMPLANT
VALVE UROSEAL ADJ ENDO (VALVE) IMPLANT
WATER STERILE IRR 1000ML POUR (IV SOLUTION) ×3 IMPLANT

## 2020-02-19 NOTE — ED Notes (Signed)
Pt continues to vomit. Zofran requested for administration prior to oral med administration.

## 2020-02-19 NOTE — ED Provider Notes (Signed)
Admission discussed with Dr. Arville Care, accepting to hospitalist with urology consult planned this am   Sharyn Creamer, MD 02/19/20 [phone]

## 2020-02-19 NOTE — Anesthesia Postprocedure Evaluation (Signed)
Anesthesia Post Note  Patient: Sheryl Tucker  Procedure(s) Performed: URETEROSCOPY WITH HOLMIUM LASER LITHOTRIPSY (Right ) CYSTOSCOPY WITH STENT PLACEMENT; RETROGRADE AND PYLOGRAM (N/A )  Patient location during evaluation: PACU Anesthesia Type: General Level of consciousness: awake and alert and oriented Pain management: pain level controlled Vital Signs Assessment: post-procedure vital signs reviewed and stable Respiratory status: spontaneous breathing, nonlabored ventilation and respiratory function stable Cardiovascular status: blood pressure returned to baseline and stable Postop Assessment: no signs of nausea or vomiting Anesthetic complications: no   No complications documented.   Last Vitals:  Vitals:   02/19/20 1431 02/19/20 1449  BP: (!) 108/57 (!) 113/56  Pulse: (!) 57 (!) 54  Resp: 14 18  Temp:  37 C  SpO2: 96% 100%    Last Pain:  Vitals:   02/19/20 1449  TempSrc: Oral  PainSc:                  Juliah Scadden

## 2020-02-19 NOTE — Progress Notes (Signed)
Anticoagulation monitoring(Lovenox):  24 yo  female ordered Lovenox 40 mg Q24h  Filed Weights   02/18/20 1959  Weight: 97.1 kg (214 lb)   BMI 36.7    Lab Results  Component Value Date   CREATININE 0.72 02/18/2020   CREATININE 0.82 12/25/2019   CREATININE 0.73 12/24/2019   Estimated Creatinine Clearance: 124.9 mL/min (by C-G formula based on SCr of 0.72 mg/dL). Hemoglobin & Hematocrit     Component Value Date/Time   HGB 14.9 02/18/2020 2002   HGB 13.9 05/13/2012 1015   HCT 44.4 02/18/2020 2002   HCT 41.8 05/13/2012 1015     Per Protocol for Patient with estCrcl > 30 ml/min and BMI > 30, will transition to Lovenox 50 mg Q24h.
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
## 2020-02-19 NOTE — Op Note (Signed)
Date of procedure: 02/19/20  Preoperative diagnosis:  1. Right 5 mm distal ureteral stone  Postoperative diagnosis:  1. Same  Procedure: 1. Cystoscopy, right ureteroscopy, laser lithotripsy, right retrograde pyelogram with intraoperative interpretation, right ureteral stent placement  Surgeon: Nickolas Madrid, MD  Anesthesia: General  Complications: None  Intraoperative findings:  1.  Normal cystoscopy, ureteral orifices orthotopic bilaterally 2.  Very tight and edematous right distal ureter, able to fragment and irrigate free ureteral stone pieces 3.  Unable to pass flexible ureteroscope to treat smaller nonobstructing renal stones secondary to distal ureteral edema 4.  Uncomplicated right ureteral stent placement  EBL: Minimal  Specimens: Stones for analysis  Drains: Right 6 French by 24 cm ureteral stent  Indication: Sheryl Tucker is a 24 y.o. patient with a 5 mm right distal ureteral stone and uncontrolled renal colic and nausea vomiting who opted for ureteroscopy.  After reviewing the management options for treatment, they elected to proceed with the above surgical procedure(s). We have discussed the potential benefits and risks of the procedure, side effects of the proposed treatment, the likelihood of the patient achieving the goals of the procedure, and any potential problems that might occur during the procedure or recuperation. Informed consent has been obtained.  Description of procedure:  The patient was taken to the operating room and general anesthesia was induced. SCDs were placed for DVT prophylaxis. The patient was placed in the dorsal lithotomy position, prepped and draped in the usual sterile fashion, and preoperative antibiotics(Ancef) were administered. A preoperative time-out was performed.   A 21 French rigid cystoscope was used to intubate the urethra and thorough cystoscopy was performed.  The bladder was grossly normal, and the ureteral orifices were  orthotopic bilaterally.  A sensor wire advanced easily into the right distal ureter and up into the kidney under fluoroscopic vision.  With the aid of a second sensor wire, I was able to navigate the semirigid ureteroscope into the right distal ureter which was extremely tight and edematous.  I identified a 5 mm yellow hard stone in the distal ureter, and this was fragmented to dust using the 200 m laser fiber on settings of 0.5 J and 40 Hz.  All fragments were irrigated free from the ureter, and these were sent for analysis.  Thorough ureteroscopy revealed no other ureteral stones.  A retrograde pyelogram was performed from the mid ureter and showed no extravasation.  A safety wire was passed back through the semirigid ureteroscope into the kidney.  I attempted to pass the single channel flexible ureteroscope over the wire to treat her small nonobstructive renal stones, however met significant resistance at the distal ureter.  With her significant edema, I opted not to perform any dilation to attempt to treat her small renal nonobstructive fragments.  A rigid cystoscope was backloaded over the wire, and a 6 [place] by 24 cm ureteral stent was uneventfully placed.  Brisk drainage of fluid was seen through the side ports of the stent.  The bladder was drained, and a belladonna suppository was placed.  Disposition: Stable to PACU  Plan: Follow-up in clinic in 1 week for stent removal Flomax and oxybutynin for stent pain Bactrim DS daily while stent in place Follow-up stone analysis  Nickolas Madrid, MD

## 2020-02-19 NOTE — ED Provider Notes (Signed)
Have reassessed the patient approximately 1 hour ago, she continues to have intractable pain with occasional vomiting even after multiple treatments.  Patient being given hydromorphone and Phenergan now for intractable nausea vomiting and pain.  Have discussed case with Dr. Irish Elders of urology, he advises will be able consult on the patient early this morning and does request admission to the hospitalist service for treatment of pain and symptom control at this time.  Advises of hospitalist unable to admit he would be happy to place admitting orders on his service.  He does request patient remain n.p.o. and have Covid swab performed  Patient is understanding and agreeable with plan for admission for pain control and urology consult   Sharyn Creamer, MD 02/19/20 [phone]

## 2020-02-19 NOTE — Anesthesia Procedure Notes (Signed)
Procedure Name: Intubation Performed by: Malva Cogan, CRNA Pre-anesthesia Checklist: Patient identified, Patient being monitored, Timeout performed, Emergency Drugs available and Suction available Patient Re-evaluated:Patient Re-evaluated prior to induction Oxygen Delivery Method: Circle system utilized Preoxygenation: Pre-oxygenation with 100% oxygen Induction Type: IV induction and Rapid sequence Laryngoscope Size: 3 and McGraph Grade View: Grade I Tube type: Oral Tube size: 7.0 mm Number of attempts: 1 Airway Equipment and Method: Stylet and Video-laryngoscopy Placement Confirmation: ETT inserted through vocal cords under direct vision,  positive ETCO2 and breath sounds checked- equal and bilateral Secured at: 21 cm Tube secured with: Tape Dental Injury: Teeth and Oropharynx as per pre-operative assessment

## 2020-02-19 NOTE — Progress Notes (Signed)
[facility]  - [place] [facility]   PATIENT NAME: Sheryl Tucker    MR#:  [id number]  DATE OF BIRTH:  [birth year]  SUBJECTIVE:   Patient came in with significant abdominal pain nausea and vomiting. She was found to have right distal ureteral stone.  Patient is status post cystoscopy with right ureteral stent placement and stone removal. She feels little better however very sleepy. Does not feel like going home tonight REVIEW OF SYSTEMS:   ROS Tolerating Diet: Tolerating PT:   DRUG ALLERGIES:   Allergies  Allergen Reactions  . Oxycodone-Acetaminophen Other (See Comments) and Anxiety    Shaking Other reaction(s): Unknown, Unknown    VITALS:  Blood pressure 123/70, pulse (!) 51, temperature 97.6 F (36.4 C), temperature source Oral, resp. rate 20, height 5\' 4"  (1.626 m), weight 97.1 kg, last menstrual period 01/01/2020, SpO2 100 %.  PHYSICAL EXAMINATION:   Physical Exam  GENERAL:  24 y.o.-year-old patient lying in the bed with no acute distress. Obese HEENT: Head atraumatic, normocephalic. Oropharynx and nasopharynx clear.  NECK:  Supple, no jugular venous distention. No thyroid enlargement, no tenderness.  LUNGS: Normal breath sounds bilaterally, no wheezing, rales, rhonchi. No use of accessory muscles of respiration.  CARDIOVASCULAR: S1, S2 normal. No murmurs, rubs, or gallops.  ABDOMEN: Soft, nontender, nondistended. Bowel sounds present. No organomegaly or mass.  EXTREMITIES: No cyanosis, clubbing or edema b/l.    NEUROLOGIC: Cranial nerves II through XII are intact. No focal Motor or sensory deficits b/l.   PSYCHIATRIC:  patient is alert and oriented x 3.  SKIN: No obvious rash, lesion, or ulcer.   LABORATORY PANEL:  CBC Recent Labs  Lab 02/19/20 0344  WBC 16.2*  HGB 14.5  HCT 44.5  PLT 303    Chemistries  Recent Labs  Lab 02/19/20 0344  NA 139  K 3.9  CL 107  CO2 21*  GLUCOSE 88  BUN 11  CREATININE 0.81  CALCIUM 8.8*   Cardiac  Enzymes No results for input(s): TROPONINI in the last 168 hours. RADIOLOGY:  DG OR UROLOGY CYSTO IMAGE ([facility] ONLY)  Result Date: 02/19/2020 There is no interpretation for this exam.  This order is for images obtained during a surgical procedure.  Please See "Surgeries" Tab for more information regarding the procedure.   CT Renal Stone Study  Result Date: 02/18/2020 CLINICAL DATA:  Right-sided flank pain EXAM: CT ABDOMEN AND PELVIS WITHOUT CONTRAST TECHNIQUE: Multidetector CT imaging of the abdomen and pelvis was performed following the standard protocol without IV contrast. COMPARISON:  December 24, 2019 FINDINGS: Lower chest: The visualized heart size within normal limits. No pericardial fluid/thickening. No hiatal hernia. The visualized portions of the lungs are clear. Hepatobiliary: Although limited due to the lack of intravenous contrast, normal in appearance without gross focal abnormality. No evidence of calcified gallstones or biliary ductal dilatation. Pancreas:  Unremarkable.  No surrounding inflammatory changes. Spleen: Normal in size. Although limited due to the lack of intravenous contrast, normal in appearance. Adrenals/Urinary Tract: Both adrenal glands appear normal. Mild right pelvicaliectasis and ureterectasis is seen down to the level the distal ureter where there is a 4 mm calculus present. There is also mild right renal enlargement and perinephric stranding. There is a punctate calcifications seen in the upper pole of the right kidney. There is also punctate calcifications seen in the midpole of the left kidney. The bladder is partially decompressed. Stomach/Bowel: The stomach, small bowel, and colon are normal in appearance. No inflammatory changes or  obstructive findings. appendix is normal. Vascular/Lymphatic: There are no enlarged abdominal or pelvic lymph nodes. No significant gross vascular findings are present given the lack of intravenous contrast. Reproductive: The uterus  and adnexa are unremarkable. Other: No evidence of abdominal wall mass or hernia. Musculoskeletal: No acute or significant osseous findings. IMPRESSION: Mild right hydronephrosis to the distal ureter where there is a 4 mm calculus present. Punctate bilateral nonobstructing renal calculi. Electronically Signed   By: Jonna Clark M.D.   On: 02/18/2020 22:09   ASSESSMENT AND PLAN:  Sheryl Tucker  is a 24 y.o. Caucasian female with a known history of depression, hypothyroidism, posttraumatic stress disorder and seasonal allergies, who presented to the emergency room with a concern of right-sided lower quadrant abdominal pain with associated radiation to her right flank, recurrent nausea and vomiting since 8 PM.  She denied any fever or chills  1.  Right distal ureteral stone with subsequent right obstructive uropathy with associated nephrolithiasis. -Continue IV fluids. PRN and PO pain meds -urology consultation with Dr Richardo Hanks-- patient is status post cystoscopy laser lithotripsy and right ureteral stent placement -continue Flomax, oxybutynin -- already on Keflex. Will discontinue Bactrim  -2.  Intractable nausea and vomiting, likely secondary to #1. -improved. Patient able to tolerate PO diet.  3.  Posttraumatic stress disorder. -- continue Lexapro.  4.  History of left ankle ankle surgery costochondritis -continue Keflex that was given to patient two days ago at discharge.  5.  DVT prophylaxis. -Subcutaneous Lovenox   Procedures:Cystoscopy, right ureteroscopy, laser lithotripsy, right retrograde pyelogram with intraoperative interpretation, right ureteral stent placement Family communication : urologist has discussed with mother consults : CODE STATUS: urology DVT Prophylaxis : Lovenox  Status is: Inpatient  Remains inpatient appropriate because:Ongoing active pain requiring inpatient pain management   Dispo: The patient is from: Home              Anticipated d/c is to: Home               Anticipated d/c date is: 1 day              Patient currently is not medically stable to d/c.        TOTAL TIME TAKING CARE OF THIS PATIENT: 25 minutes.  >50% time spent on counselling and coordination of care  Note: This dictation was prepared with Dragon dictation along with smaller phrase technology. Any transcriptional errors that result from this process are unintentional.  Enedina Finner M.D    [facility]   CC: Primary care physician; Nathaneil Canary, PA-CPatient ID: [name], female   DOB: 18-Aug-1995, 24 y.o.   MRN: [id number]

## 2020-02-19 NOTE — Transfer of Care (Signed)
Immediate Anesthesia Transfer of Care Note  Patient: Sheryl Tucker  Procedure(s) Performed: URETEROSCOPY WITH HOLMIUM LASER LITHOTRIPSY (Right ) CYSTOSCOPY WITH STENT PLACEMENT; RETROGRADE AND PYLOGRAM (N/A )  Patient Location: PACU  Anesthesia Type:General  Level of Consciousness: awake and alert   Airway & Oxygen Therapy: Patient Spontanous Breathing and Patient connected to face mask oxygen  Post-op Assessment: Report given to RN and Post -op Vital signs reviewed and stable  Post vital signs: Reviewed and stable  Last Vitals:  Vitals Value Taken Time  BP 116/47 02/19/20 1351  Temp    Pulse 61 02/19/20 1353  Resp 18 02/19/20 1353  SpO2 100 % 02/19/20 1353  Vitals shown include unvalidated device data.  Last Pain:  Vitals:   02/19/20 1351  TempSrc:   PainSc: (P) Asleep         Complications: No complications documented.
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
## 2020-02-20 ENCOUNTER — Encounter: Payer: Self-pay | Admitting: Urology

## 2020-02-20 DIAGNOSIS — N200 Calculus of kidney: Secondary | ICD-10-CM | POA: Diagnosis not present

## 2020-02-20 DIAGNOSIS — R112 Nausea with vomiting, unspecified: Secondary | ICD-10-CM | POA: Diagnosis not present

## 2020-02-20 MED ORDER — TAMSULOSIN HCL 0.4 MG PO CAPS
0.4000 mg | ORAL_CAPSULE | Freq: Every day | ORAL | 0 refills | Status: DC
Start: 2020-02-20 — End: 2020-06-07

## 2020-02-20 MED ORDER — TAMSULOSIN HCL 0.4 MG PO CAPS
0.4000 mg | ORAL_CAPSULE | Freq: Every day | ORAL | 0 refills | Status: DC
Start: 2020-02-20 — End: 2020-02-20

## 2020-02-20 MED ORDER — OXYBUTYNIN CHLORIDE ER 10 MG PO TB24
10.0000 mg | ORAL_TABLET | Freq: Every day | ORAL | 0 refills | Status: DC
Start: 2020-02-20 — End: 2020-03-03

## 2020-02-20 MED ORDER — OXYBUTYNIN CHLORIDE ER 10 MG PO TB24
10.0000 mg | ORAL_TABLET | Freq: Every day | ORAL | 0 refills | Status: DC
Start: 2020-02-20 — End: 2020-02-20

## 2020-02-20 NOTE — Discharge Instructions (Signed)
  Take the [facility] prescribed by your prior doctor, 1-2 tablets every 6 hours, for your flank pain. Take a higher dose than for your ankle if needed.  Take Celebrex as usual  F/u Urology next week. Pt to call and make appt to see Dr Richardo Hanks

## 2020-02-20 NOTE — Plan of Care (Signed)
  Problem: Education: Goal: Knowledge of General Education information will improve Description: Including pain rating scale, medication(s)/side effects and non-pharmacologic comfort measures 02/20/2020 1441 by Orvan Seen, RN Outcome: Completed/Met 02/20/2020 1441 by Orvan Seen, RN Outcome: Progressing   Problem: Health Behavior/Discharge Planning: Goal: Ability to manage health-related needs will improve 02/20/2020 1441 by Orvan Seen, RN Outcome: Completed/Met 02/20/2020 1441 by Orvan Seen, RN Outcome: Progressing   Problem: Clinical Measurements: Goal: Ability to maintain clinical measurements within normal limits will improve 02/20/2020 1441 by Orvan Seen, RN Outcome: Completed/Met 02/20/2020 1441 by Orvan Seen, RN Outcome: Progressing Goal: Will remain free from infection 02/20/2020 1441 by Orvan Seen, RN Outcome: Completed/Met 02/20/2020 1441 by Orvan Seen, RN Outcome: Progressing Goal: Diagnostic test results will improve 02/20/2020 1441 by Orvan Seen, RN Outcome: Completed/Met 02/20/2020 1441 by Orvan Seen, RN Outcome: Progressing Goal: Respiratory complications will improve 02/20/2020 1441 by Orvan Seen, RN Outcome: Completed/Met 02/20/2020 1441 by Orvan Seen, RN Outcome: Progressing Goal: Cardiovascular complication will be avoided 02/20/2020 1441 by Orvan Seen, RN Outcome: Completed/Met 02/20/2020 1441 by Orvan Seen, RN Outcome: Progressing   Problem: Activity: Goal: Risk for activity intolerance will decrease 02/20/2020 1441 by Orvan Seen, RN Outcome: Completed/Met 02/20/2020 1441 by Orvan Seen, RN Outcome: Progressing   Problem: Nutrition: Goal: Adequate nutrition will be maintained 02/20/2020 1441 by Orvan Seen, RN Outcome: Completed/Met 02/20/2020 1441 by Orvan Seen, RN Outcome: Progressing   Problem: Coping: Goal: Level of anxiety will  decrease 02/20/2020 1441 by Orvan Seen, RN Outcome: Completed/Met 02/20/2020 1441 by Orvan Seen, RN Outcome: Progressing   Problem: Elimination: Goal: Will not experience complications related to bowel motility 02/20/2020 1441 by Orvan Seen, RN Outcome: Completed/Met 02/20/2020 1441 by Orvan Seen, RN Outcome: Progressing Goal: Will not experience complications related to urinary retention 02/20/2020 1441 by Orvan Seen, RN Outcome: Completed/Met 02/20/2020 1441 by Orvan Seen, RN Outcome: Progressing   Problem: Pain Managment: Goal: General experience of comfort will improve 02/20/2020 1441 by Orvan Seen, RN Outcome: Completed/Met 02/20/2020 1441 by Orvan Seen, RN Outcome: Progressing   Problem: Safety: Goal: Ability to remain free from injury will improve 02/20/2020 1441 by Orvan Seen, RN Outcome: Completed/Met 02/20/2020 1441 by Orvan Seen, RN Outcome: Progressing   Problem: Skin Integrity: Goal: Risk for impaired skin integrity will decrease 02/20/2020 1441 by Orvan Seen, RN Outcome: Completed/Met 02/20/2020 1441 by Orvan Seen, RN Outcome: Progressing
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
## 2020-02-20 NOTE — Progress Notes (Signed)
Patient discharged to home wheeled out of unit by care RN with all belongings.  Discharge instructions and AVS reviewed and given to patient.  All questions answered.  PIV removed, no bleeding noted, intact.  Patient verbalized understanding of signs and symptoms of infection.  Patient satisfied with overall care at [facility].

## 2020-02-20 NOTE — Plan of Care (Signed)

## 2020-02-24 ENCOUNTER — Encounter: Payer: Self-pay | Admitting: Emergency Medicine

## 2020-02-24 ENCOUNTER — Emergency Department
Admission: EM | Admit: 2020-02-24 | Discharge: 2020-02-24 | Disposition: A | Discharge status: Home / Self Care | Payer: Medicaid Other | Attending: Student in an Organized Health Care Education/Training Program | Admitting: Student in an Organized Health Care Education/Training Program

## 2020-02-24 ENCOUNTER — Ambulatory Visit (INDEPENDENT_AMBULATORY_CARE_PROVIDER_SITE_OTHER): Payer: Medicaid Other | Admitting: Urology

## 2020-02-24 ENCOUNTER — Encounter: Payer: Self-pay | Admitting: Urology

## 2020-02-24 ENCOUNTER — Telehealth: Payer: Self-pay

## 2020-02-24 ENCOUNTER — Other Ambulatory Visit: Payer: Self-pay

## 2020-02-24 VITALS — BP 116/79 | HR 69 | Ht 64.0 in | Wt 230.0 lb

## 2020-02-24 DIAGNOSIS — N200 Calculus of kidney: Secondary | ICD-10-CM

## 2020-02-24 DIAGNOSIS — E039 Hypothyroidism, unspecified: Secondary | ICD-10-CM | POA: Insufficient documentation

## 2020-02-24 DIAGNOSIS — R109 Unspecified abdominal pain: Secondary | ICD-10-CM | POA: Diagnosis present

## 2020-02-24 DIAGNOSIS — Z466 Encounter for fitting and adjustment of urinary device: Secondary | ICD-10-CM | POA: Diagnosis not present

## 2020-02-24 LAB — CBC
HCT: 41.5 % (ref 36.0–46.0)
Hemoglobin: 14.4 g/dL (ref 12.0–15.0)
MCH: 28.1 pg (ref 26.0–34.0)
MCHC: 34.7 g/dL (ref 30.0–36.0)
MCV: 80.9 fL (ref 80.0–100.0)
Platelets: 318 10*3/uL (ref 150–400)
RBC: 5.13 MIL/uL — ABNORMAL HIGH (ref 3.87–5.11)
RDW: 13 % (ref 11.5–15.5)
WBC: 13.5 10*3/uL — ABNORMAL HIGH (ref 4.0–10.5)
nRBC: 0 % (ref 0.0–0.2)

## 2020-02-24 LAB — COMPREHENSIVE METABOLIC PANEL
ALT: 15 U/L (ref 0–44)
AST: 14 U/L — ABNORMAL LOW (ref 15–41)
Albumin: 4.1 g/dL (ref 3.5–5.0)
Alkaline Phosphatase: 71 U/L (ref 38–126)
Anion gap: 14 (ref 5–15)
BUN: 12 mg/dL (ref 6–20)
CO2: 20 mmol/L — ABNORMAL LOW (ref 22–32)
Calcium: 9.5 mg/dL (ref 8.9–10.3)
Chloride: 101 mmol/L (ref 98–111)
Creatinine, Ser: 0.74 mg/dL (ref 0.44–1.00)
GFR, Estimated: >60 mL/min (ref >60)
Glucose, Bld: 107 mg/dL — ABNORMAL HIGH (ref 70–99)
Potassium: 3.8 mmol/L (ref 3.5–5.1)
Sodium: 135 mmol/L (ref 135–145)
Total Bilirubin: 1.2 mg/dL (ref 0.3–1.2)
Total Protein: 7.6 g/dL (ref 6.5–8.1)

## 2020-02-24 LAB — CALCULI, WITH PHOTOGRAPH (CLINICAL LAB)
Calcium Oxalate Monohydrate: 100 %
Weight Calculi: 5 mg

## 2020-02-24 LAB — LIPASE, BLOOD: Lipase: 17 U/L (ref 11–51)

## 2020-02-24 MED ORDER — HYDROMORPHONE HCL 1 MG/ML IJ SOLN
1.0000 mg | INTRAMUSCULAR | Status: DC | PRN
  Administered 2020-02-24: 1 mg via INTRAVENOUS
  Filled 2020-02-24: qty 1

## 2020-02-24 MED ORDER — SODIUM CHLORIDE 0.9 % IV BOLUS
1000.0000 mL | Freq: Once | INTRAVENOUS | Status: AC
  Administered 2020-02-24: 1000 mL via INTRAVENOUS

## 2020-02-24 MED ORDER — LIDOCAINE HCL URETHRAL/MUCOSAL 2 % EX GEL
1.0000 "application " | Freq: Once | CUTANEOUS | Status: AC
  Administered 2020-02-24: 1 via URETHRAL

## 2020-02-24 MED ORDER — DROPERIDOL 2.5 MG/ML IJ SOLN
2.5000 mg | Freq: Once | INTRAMUSCULAR | Status: AC
  Administered 2020-02-24: 2.5 mg via INTRAVENOUS
  Filled 2020-02-24: qty 2

## 2020-02-24 MED ORDER — TAMSULOSIN HCL 0.4 MG PO CAPS
0.4000 mg | ORAL_CAPSULE | Freq: Once | ORAL | Status: AC
  Administered 2020-02-24: 0.4 mg via ORAL
  Filled 2020-02-24: qty 1

## 2020-02-24 MED ORDER — KETOROLAC TROMETHAMINE 30 MG/ML IJ SOLN
15.0000 mg | Freq: Once | INTRAMUSCULAR | Status: AC
  Administered 2020-02-24: 15 mg via INTRAVENOUS
  Filled 2020-02-24: qty 1
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
## 2020-02-24 NOTE — ED Triage Notes (Signed)
Pt comes into the ED via [facility] from home c/o right lower quadrant pain.  Pt had stent placed for kidney stone, and had the stent removed this morning.  Pt states her pain has been uncontrolled at this time.  Pt very tearful with EMS.  EMS gave of Fentanyl at 13:45, 4 mg zofran, and IVF NaCl at [facility]. 130/80, 70 HR, 98% RA.

## 2020-02-24 NOTE — ED Notes (Signed)
RN Erskine Squibb informed of pt in room pt placed on monitor.

## 2020-02-24 NOTE — Patient Instructions (Signed)
Dietary Guidelines to Help Prevent Kidney Stones Kidney stones are deposits of minerals and salts that form inside your kidneys. Your risk of developing kidney stones may be greater depending on your diet, your lifestyle, the medicines you take, and whether you have certain medical conditions. Most people can reduce their chances of developing kidney stones by following the instructions below. Depending on your overall health and the type of kidney stones you tend to develop, your dietitian may give you more specific instructions. What are tips for following this plan? Reading food labels  Choose foods with "no salt added" or "low-salt" labels. Limit your sodium intake to less than 1500 mg per day.  Choose foods with calcium for each meal and snack. Try to eat about 300 mg of calcium at each meal. Foods that contain 200-500 mg of calcium per serving include: ? 8 oz (237 ml) of milk, fortified nondairy milk, and fortified fruit juice. ? 8 oz (237 ml) of kefir, yogurt, and soy yogurt. ? 4 oz (118 ml) of tofu. ? 1 oz of cheese. ? 1 cup (300 g) of dried figs. ? 1 cup (91 g) of cooked broccoli. ? 1-3 oz can of sardines or mackerel.  Most people need 1000 to 1500 mg of calcium each day. Talk to your dietitian about how much calcium is recommended for you. Shopping  Buy plenty of fresh fruits and vegetables. Most people do not need to avoid fruits and vegetables, even if they contain nutrients that may contribute to kidney stones.  When shopping for convenience foods, choose: ? Whole pieces of fruit. ? Premade salads with dressing on the side. ? Low-fat fruit and yogurt smoothies.  Avoid buying frozen meals or prepared deli foods.  Look for foods with live cultures, such as yogurt and kefir. Cooking  Do not add salt to food when cooking. Place a salt shaker on the table and allow each person to add his or her own salt to taste.  Use vegetable protein, such as beans, textured vegetable  protein (TVP), or tofu instead of meat in pasta, casseroles, and soups. Meal planning   Eat less salt, if told by your dietitian. To do this: ? Avoid eating processed or premade food. ? Avoid eating fast food.  Eat less animal protein, including cheese, meat, poultry, or fish, if told by your dietitian. To do this: ? Limit the number of times you have meat, poultry, fish, or cheese each week. Eat a diet free of meat at least 2 days a week. ? Eat only one serving each day of meat, poultry, fish, or seafood. ? When you prepare animal protein, cut pieces into small portion sizes. For most meat and fish, one serving is about the size of one deck of cards.  Eat at least 5 servings of fresh fruits and vegetables each day. To do this: ? Keep fruits and vegetables on hand for snacks. ? Eat 1 piece of fruit or a handful of berries with breakfast. ? Have a salad and fruit at lunch. ? Have two kinds of vegetables at dinner.  Limit foods that are high in a substance called oxalate. These include: ? Spinach. ? Rhubarb. ? Beets. ? Potato chips and french fries. ? Nuts.  If you regularly take a diuretic medicine, make sure to eat at least 1-2 fruits or vegetables high in potassium each day. These include: ? Avocado. ? Banana. ? Orange, prune, carrot, or tomato juice. ? Baked potato. ? Cabbage. ? Beans and split   peas. General instructions   Drink enough fluid to keep your urine clear or pale yellow. This is the most important thing you can do.  Talk to your health care provider and dietitian about taking daily supplements. Depending on your health and the cause of your kidney stones, you may be advised: ? Not to take supplements with vitamin C. ? To take a calcium supplement. ? To take a daily probiotic supplement. ? To take other supplements such as magnesium, fish oil, or vitamin B6.  Take all medicines and supplements as told by your health care provider.  Limit alcohol intake to no  more than 1 drink a day for nonpregnant women and 2 drinks a day for men. One drink equals 12 oz of beer, 5 oz of wine, or 1 oz of hard liquor.  Lose weight if told by your health care provider. Work with your dietitian to find strategies and an eating plan that works best for you. What foods are not recommended? Limit your intake of the following foods, or as told by your dietitian. Talk to your dietitian about specific foods you should avoid based on the type of kidney stones and your overall health. Grains Breads. Bagels. Rolls. Baked goods. Salted crackers. Cereal. Pasta. Vegetables Spinach. Rhubarb. Beets. Canned vegetables. Pickles. Olives. Meats and other protein foods Nuts. Nut butters. Large portions of meat, poultry, or fish. Salted or cured meats. Deli meats. Hot dogs. Sausages. Dairy Cheese. Beverages Regular soft drinks. Regular vegetable juice. Seasonings and other foods Seasoning blends with salt. Salad dressings. Canned soups. Soy sauce. Ketchup. Barbecue sauce. Canned pasta sauce. Casseroles. Pizza. Lasagna. Frozen meals. Potato chips. French fries. Summary  You can reduce your risk of kidney stones by making changes to your diet.  The most important thing you can do is drink enough fluid. You should drink enough fluid to keep your urine clear or pale yellow.  Ask your health care provider or dietitian how much protein from animal sources you should eat each day, and also how much salt and calcium you should have each day. This information is not intended to replace advice given to you by your health care provider. Make sure you discuss any questions you have with your health care provider. Document Revised: 07/09/2018 Document Reviewed: 02/28/2016 Elsevier Patient Education  2020 [employer]vier Inc.  

## 2020-02-24 NOTE — Progress Notes (Signed)
Cystoscopy Procedure Note:  Indication: Stent removal s/p right ureteroscopy, laser lithotripsy, stent placement for 5 mm distal ureteral stone  After informed consent and discussion of the procedure and its risks, Sheryl Tucker was positioned and prepped in the standard fashion. Cystoscopy was performed with a flexible cystoscope. The stent was grasped with flexible graspers and removed in its entirety. The patient tolerated the procedure well.  Findings: Uncomplicated stent removal  Assessment and Plan: Follow up in 4 weeks with renal ultrasound to evaluate for silent hydronephrosis, call with ultrasound results  We discussed general stone prevention strategies including adequate hydration with goal of producing 2.5 L of urine daily, increasing citric acid intake, increasing calcium intake during high oxalate meals, minimizing animal protein, and decreasing salt intake. Information about dietary recommendations given today.     Sondra Come, MD 02/24/2020
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
## 2020-02-25 ENCOUNTER — Emergency Department: Payer: Medicaid Other

## 2020-02-25 ENCOUNTER — Emergency Department
Admission: EM | Admit: 2020-02-25 | Discharge: 2020-02-25 | Disposition: A | Discharge status: Home / Self Care | Payer: Medicaid Other | Attending: Emergency Medicine | Admitting: Emergency Medicine

## 2020-02-25 ENCOUNTER — Encounter: Payer: Self-pay | Admitting: Emergency Medicine

## 2020-02-25 DIAGNOSIS — E860 Dehydration: Secondary | ICD-10-CM

## 2020-02-25 DIAGNOSIS — R109 Unspecified abdominal pain: Secondary | ICD-10-CM | POA: Insufficient documentation

## 2020-02-25 DIAGNOSIS — R1110 Vomiting, unspecified: Secondary | ICD-10-CM | POA: Diagnosis not present

## 2020-02-25 DIAGNOSIS — Z87442 Personal history of urinary calculi: Secondary | ICD-10-CM | POA: Diagnosis not present

## 2020-02-25 DIAGNOSIS — E039 Hypothyroidism, unspecified: Secondary | ICD-10-CM | POA: Diagnosis not present

## 2020-02-25 LAB — CBC WITH DIFFERENTIAL/PLATELET
Abs Immature Granulocytes: 0.07 10*3/uL (ref 0.00–0.07)
Basophils Absolute: 0 10*3/uL (ref 0.0–0.1)
Basophils Relative: 0 %
Eosinophils Absolute: 0 10*3/uL (ref 0.0–0.5)
Eosinophils Relative: 0 %
HCT: 42.3 % (ref 36.0–46.0)
Hemoglobin: 14.3 g/dL (ref 12.0–15.0)
Immature Granulocytes: 1 %
Lymphocytes Relative: 11 %
Lymphs Abs: 1.6 10*3/uL (ref 0.7–4.0)
MCH: 27.5 pg (ref 26.0–34.0)
MCHC: 33.8 g/dL (ref 30.0–36.0)
MCV: 81.3 fL (ref 80.0–100.0)
Monocytes Absolute: 1 10*3/uL (ref 0.1–1.0)
Monocytes Relative: 7 %
Neutro Abs: 11.9 10*3/uL — ABNORMAL HIGH (ref 1.7–7.7)
Neutrophils Relative %: 81 %
Platelets: 321 10*3/uL (ref 150–400)
RBC: 5.2 MIL/uL — ABNORMAL HIGH (ref 3.87–5.11)
RDW: 13.1 % (ref 11.5–15.5)
WBC: 14.5 10*3/uL — ABNORMAL HIGH (ref 4.0–10.5)
nRBC: 0 % (ref 0.0–0.2)

## 2020-02-25 LAB — URINALYSIS, COMPLETE (UACMP) WITH MICROSCOPIC
Bilirubin Urine: NEGATIVE
Glucose, UA: NEGATIVE mg/dL
Ketones, ur: 80 mg/dL — AB
Nitrite: NEGATIVE
Protein, ur: 30 mg/dL — AB
RBC / HPF: >50 RBC/hpf — ABNORMAL HIGH (ref 0–5)
Specific Gravity, Urine: 1.017 (ref 1.005–1.030)
pH: 5 (ref 5.0–8.0)

## 2020-02-25 LAB — BASIC METABOLIC PANEL
Anion gap: 16 — ABNORMAL HIGH (ref 5–15)
BUN: 12 mg/dL (ref 6–20)
CO2: 19 mmol/L — ABNORMAL LOW (ref 22–32)
Calcium: 9.8 mg/dL (ref 8.9–10.3)
Chloride: 101 mmol/L (ref 98–111)
Creatinine, Ser: 0.98 mg/dL (ref 0.44–1.00)
GFR, Estimated: >60 mL/min (ref >60)
Glucose, Bld: 101 mg/dL — ABNORMAL HIGH (ref 70–99)
Potassium: 3.5 mmol/L (ref 3.5–5.1)
Sodium: 136 mmol/L (ref 135–145)

## 2020-02-25 LAB — PREGNANCY, URINE: Preg Test, Ur: NEGATIVE

## 2020-02-25 MED ORDER — KETOROLAC TROMETHAMINE 30 MG/ML IJ SOLN
30.0000 mg | Freq: Once | INTRAMUSCULAR | Status: AC
  Administered 2020-02-25: 30 mg via INTRAVENOUS
  Filled 2020-02-25: qty 1

## 2020-02-25 MED ORDER — DROPERIDOL 2.5 MG/ML IJ SOLN
1.2500 mg | Freq: Once | INTRAMUSCULAR | Status: AC
  Administered 2020-02-25: 1.25 mg via INTRAVENOUS
  Filled 2020-02-25: qty 2

## 2020-02-25 MED ORDER — METOCLOPRAMIDE HCL 5 MG/ML IJ SOLN
10.0000 mg | Freq: Once | INTRAMUSCULAR | Status: AC
  Administered 2020-02-25: 10 mg via INTRAVENOUS
  Filled 2020-02-25: qty 2

## 2020-02-25 MED ORDER — SODIUM CHLORIDE 0.9 % IV BOLUS
1000.0000 mL | Freq: Once | INTRAVENOUS | Status: AC
  Administered 2020-02-25: 1000 mL via INTRAVENOUS

## 2020-02-25 MED ORDER — MORPHINE SULFATE (PF) 4 MG/ML IV SOLN
4.0000 mg | Freq: Once | INTRAVENOUS | Status: AC
  Administered 2020-02-25: 4 mg via INTRAVENOUS
  Filled 2020-02-25: qty 1

## 2020-02-25 MED ORDER — IOHEXOL 300 MG/ML  SOLN
100.0000 mL | Freq: Once | INTRAMUSCULAR | Status: AC | PRN
  Administered 2020-02-25: 100 mL via INTRAVENOUS

## 2020-02-25 MED ORDER — ONDANSETRON HCL 4 MG/2ML IJ SOLN
4.0000 mg | Freq: Once | INTRAMUSCULAR | Status: AC
  Administered 2020-02-25: 4 mg via INTRAVENOUS
  Filled 2020-02-25: qty 2

## 2020-02-25 NOTE — ED Notes (Signed)
Patient given warm blanket, water, strawberry popsicle, apple sauce
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
## 2020-02-25 NOTE — ED Notes (Signed)
Assumed care of pt upon being roomed, reports 8/10 pain in R flank. Denies hematuria, dysuria, or increased urgency. Reports incontinence when vomiting, placed on purewick. Awaiting CT. Pt medicated per MAR. AO x4, side rails up x2, call bell within reach. Talking in full sentences with regular and unlabored breathing.

## 2020-02-25 NOTE — ED Triage Notes (Signed)
Patient to ER for c/o right lower quadrant pain after stent removal yesterday. Patient had stent placed for 40mm stone approx one week ago. Patient reports she has now been vomiting every hour and in severe pain.

## 2020-02-25 NOTE — ED Provider Notes (Signed)
Patient received in signout from Dr. Vicente Males.  Briefly, 24 year old female with history of ureteral stones and ureteral stent placement by urology, this was removed less than 48 hours ago.  This is patient's second ED visit in this timeframe for associated pain, previously attributed to ureteral spasm.  CT imaging reviewed by me with evidence of mild hydronephrosis without evidence of repeat ureterolithiasis.  Patient reports resolving symptoms after IV rehydration and analgesia.  Her pain is likely due to continued ureteral spasm.  She is subsequently tolerating p.o. intake without complication and has good follow-up with urology.  Discussed return precautions for the ED and patient is medically stable for discharge home.  Clinical Course as of Feb 25 1199  Thu Feb 25, 2020  9030 Patient received in signout from Dr. Vicente Males   [DS]  [phone] I reassessed the patient.  She reports improving symptoms after morphine and IV fluids.  Awaiting CT imaging.  She provide [place] a urine sample with negative pregnancy test, but UA pending.   [DS]  0953 Reassessed.  Educated patient on CT results.  We discussed likely outpatient management.  She reports continued nausea since her arrival despite Zofran.  Ordered Reglan and we discussed p.o. challenge for discharge.   [DS]    Clinical Course User Index [DS] Delton Prairie, MD      Delton Prairie, MD 02/25/20 [phone]

## 2020-02-25 NOTE — Discharge Instructions (Addendum)
As we discussed, there is some mild and nonspecific swelling around your right-sided ureter but no signs of further stones or obstruction.  Please continue your pain medication at home and follow-up with Dr. Joselyn Glassman after your scheduled ultrasound.  If you develop any worsening symptoms despite these medications, please return to the ED.

## 2020-02-25 NOTE — ED Notes (Signed)
Pt unable to sign for d/c due to broken signature pad. Pt verbalizes d/c instructions at this time with no further questions.

## 2020-02-26 ENCOUNTER — Encounter (HOSPITAL_COMMUNITY): Payer: Self-pay | Admitting: Emergency Medicine

## 2020-02-26 ENCOUNTER — Other Ambulatory Visit: Payer: Self-pay

## 2020-02-26 ENCOUNTER — Emergency Department (HOSPITAL_COMMUNITY)
Admission: EM | Admit: 2020-02-26 | Discharge: 2020-02-27 | Disposition: A | Discharge status: Home / Self Care | Payer: Medicaid Other | Attending: Emergency Medicine | Admitting: Emergency Medicine

## 2020-02-26 DIAGNOSIS — Z79899 Other long term (current) drug therapy: Secondary | ICD-10-CM | POA: Insufficient documentation

## 2020-02-26 DIAGNOSIS — R1031 Right lower quadrant pain: Secondary | ICD-10-CM | POA: Insufficient documentation

## 2020-02-26 DIAGNOSIS — N3289 Other specified disorders of bladder: Secondary | ICD-10-CM | POA: Insufficient documentation

## 2020-02-26 DIAGNOSIS — E039 Hypothyroidism, unspecified: Secondary | ICD-10-CM | POA: Insufficient documentation

## 2020-02-26 DIAGNOSIS — M549 Dorsalgia, unspecified: Secondary | ICD-10-CM | POA: Insufficient documentation

## 2020-02-26 DIAGNOSIS — R1110 Vomiting, unspecified: Secondary | ICD-10-CM | POA: Diagnosis present

## 2020-02-26 LAB — COMPREHENSIVE METABOLIC PANEL
ALT: 16 U/L (ref 0–44)
AST: 16 U/L (ref 15–41)
Albumin: 4.4 g/dL (ref 3.5–5.0)
Alkaline Phosphatase: 73 U/L (ref 38–126)
Anion gap: 17 — ABNORMAL HIGH (ref 5–15)
BUN: 10 mg/dL (ref 6–20)
CO2: 20 mmol/L — ABNORMAL LOW (ref 22–32)
Calcium: 10.3 mg/dL (ref 8.9–10.3)
Chloride: 102 mmol/L (ref 98–111)
Creatinine, Ser: 0.8 mg/dL (ref 0.44–1.00)
GFR, Estimated: >60 mL/min (ref >60)
Glucose, Bld: 81 mg/dL (ref 70–99)
Potassium: 3.6 mmol/L (ref 3.5–5.1)
Sodium: 139 mmol/L (ref 135–145)
Total Bilirubin: 1.2 mg/dL (ref 0.3–1.2)
Total Protein: 8 g/dL (ref 6.5–8.1)

## 2020-02-26 LAB — URINALYSIS, ROUTINE W REFLEX MICROSCOPIC
Bilirubin Urine: NEGATIVE
Glucose, UA: NEGATIVE mg/dL
Ketones, ur: 80 mg/dL — AB
Nitrite: NEGATIVE
Protein, ur: 100 mg/dL — AB
RBC / HPF: >50 RBC/hpf — ABNORMAL HIGH (ref 0–5)
Specific Gravity, Urine: 1.021 (ref 1.005–1.030)
pH: 5 (ref 5.0–8.0)

## 2020-02-26 LAB — I-STAT BETA HCG BLOOD, ED (MC, WL, AP ONLY): I-stat hCG, quantitative: <5 m[IU]/mL (ref <5)

## 2020-02-26 LAB — CBC
HCT: 47.5 % — ABNORMAL HIGH (ref 36.0–46.0)
Hemoglobin: 15.4 g/dL — ABNORMAL HIGH (ref 12.0–15.0)
MCH: 27.8 pg (ref 26.0–34.0)
MCHC: 32.4 g/dL (ref 30.0–36.0)
MCV: 85.7 fL (ref 80.0–100.0)
Platelets: 351 10*3/uL (ref 150–400)
RBC: 5.54 MIL/uL — ABNORMAL HIGH (ref 3.87–5.11)
RDW: 13.2 % (ref 11.5–15.5)
WBC: 16.7 10*3/uL — ABNORMAL HIGH (ref 4.0–10.5)
nRBC: 0 % (ref 0.0–0.2)

## 2020-02-26 LAB — LIPASE, BLOOD: Lipase: 21 U/L (ref 11–51)

## 2020-02-26 MED ORDER — CEPHALEXIN 500 MG PO CAPS: 500.0000 mg | ORAL_CAPSULE | Freq: Three times a day (TID) | ORAL | 0 refills | Status: AC

## 2020-02-26 MED ORDER — METOCLOPRAMIDE HCL 5 MG/ML IJ SOLN
10.0000 mg | Freq: Once | INTRAMUSCULAR | Status: AC
  Administered 2020-02-26: 10 mg via INTRAVENOUS
  Filled 2020-02-26: qty 2

## 2020-02-26 MED ORDER — MORPHINE SULFATE (PF) 4 MG/ML IV SOLN
4.0000 mg | Freq: Once | INTRAVENOUS | Status: AC
  Administered 2020-02-26: 4 mg via INTRAVENOUS
  Filled 2020-02-26: qty 1

## 2020-02-26 MED ORDER — KETOROLAC TROMETHAMINE 15 MG/ML IJ SOLN
15.0000 mg | Freq: Once | INTRAMUSCULAR | Status: AC
  Administered 2020-02-26: 15 mg via INTRAVENOUS
  Filled 2020-02-26: qty 1

## 2020-02-26 MED ORDER — CEPHALEXIN 250 MG PO CAPS
500.0000 mg | ORAL_CAPSULE | Freq: Once | ORAL | Status: AC
  Administered 2020-02-26: 500 mg via ORAL
  Filled 2020-02-26: qty 2

## 2020-02-26 MED ORDER — HALOPERIDOL LACTATE 5 MG/ML IJ SOLN
2.0000 mg | Freq: Once | INTRAMUSCULAR | Status: AC
  Administered 2020-02-26: 2 mg via INTRAVENOUS
  Filled 2020-02-26: qty 1

## 2020-02-26 MED ORDER — METOCLOPRAMIDE HCL 10 MG PO TABS: 10.0000 mg | ORAL_TABLET | Freq: Four times a day (QID) | ORAL | 0 refills | Status: DC | PRN

## 2020-02-26 MED ORDER — LACTATED RINGERS IV BOLUS
1000.0000 mL | Freq: Once | INTRAVENOUS | Status: AC
  Administered 2020-02-26: 1000 mL via INTRAVENOUS

## 2020-02-26 NOTE — ED Notes (Signed)
Pt given water and saltine crackers, per MD. Will reassess pt in 30 minutes.
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
## 2020-02-26 NOTE — ED Triage Notes (Addendum)
Pt to ED with c/o nausea and vomiting xs 2 weeks  Unable to keep anything down.  Also c/o continued right flank pain since having stent removed for kidney stone 2 days ago  Pt sts Zofran does not help

## 2020-02-26 NOTE — ED Notes (Signed)
Pt still has some complaints of nausea. MD made aware.

## 2020-03-01 LAB — URINALYSIS, COMPLETE
Bilirubin, UA: NEGATIVE
Glucose, UA: NEGATIVE
Nitrite, UA: NEGATIVE
Specific Gravity, UA: 1.025 (ref 1.005–1.030)
Urobilinogen, Ur: 1 mg/dL (ref 0.2–1.0)
pH, UA: 6 (ref 5.0–7.5)

## 2020-03-01 LAB — MICROSCOPIC EXAMINATION
Epithelial Cells (non renal): >10 /hpf — AB (ref 0–10)
RBC, Urine: >30 /hpf — AB (ref 0–2)

## 2020-03-03 ENCOUNTER — Other Ambulatory Visit: Payer: Self-pay

## 2020-03-03 ENCOUNTER — Ambulatory Visit (INDEPENDENT_AMBULATORY_CARE_PROVIDER_SITE_OTHER): Payer: Medicaid Other | Admitting: Urology

## 2020-03-03 ENCOUNTER — Encounter: Payer: Self-pay | Admitting: Urology

## 2020-03-03 VITALS — BP 95/67 | HR 79 | Ht 64.0 in | Wt 201.0 lb

## 2020-03-03 DIAGNOSIS — N200 Calculus of kidney: Secondary | ICD-10-CM

## 2020-03-03 MED ORDER — SULFAMETHOXAZOLE-TRIMETHOPRIM 800-160 MG PO TABS: 1.0000 | ORAL_TABLET | Freq: Two times a day (BID) | ORAL | 0 refills | Status: DC

## 2020-03-03 NOTE — Progress Notes (Signed)
   03/03/2020 3:09 PM   [name] Aug 01, 1995 [id number]  Reason for visit: Follow up nephrolithiasis, flank pain  HPI: I saw Sheryl Tucker and Sheryl Tucker back in urology clinic today for follow-up of kidney stones and flank pain.  She is a 24 year old female who underwent a right-sided ureteroscopy, laser lithotripsy, and stent placement for a 5 mm stone on 02/19/2020 with uncontrolled renal colic.  The right ureter was extremely edematous and tight at that time.  She was having stent related symptoms, and Sheryl stent was ultimately removed on 02/24/2020.  She had significant flank pain and nausea after stent removal resulting in for ED visits over a 2-day period.   A CT was performed on 02/25/2020 that showed some mild right-sided hydronephrosis with some enhancement of the distal right ureter, but no ureteral stones.  Urinalysis was equivocal with trace leukocytes, greater than 50 RBCs, 11-20 WBCs, rare bacteria, but were not sent for culture.  Sheryl PCP in an outside system apparently sent a urinalysis for culture that is still pending.  She has been treated with Flomax, oxybutynin, Zofran, and Toradol, and Sheryl pain has improved significantly over the last few days.  She is only having some spasms overnight and in the morning, and does pretty well during the day aside from some mild dysuria.  I recommended a short course of Bactrim in the case of possible UTI, and we will follow-up culture results.  I also recommended continuing the Flomax and oxybutynin scheduled for the next week, with Zofran and Toradol as needed for nausea and pain respectively.  Sheryl stone type was calcium oxalate.  We discussed general stone prevention strategies including adequate hydration with goal of producing 2.5 L of urine daily, increasing citric acid intake, increasing calcium intake during high oxalate meals, minimizing animal protein, and decreasing salt intake. Information about dietary recommendations given today.   PTH  and calcium sent today, call with results Medications as above for any recurrent flank pain Follow-up cultures 4 weeks renal ultrasound, call with results    Sondra Come, MD  [facility] [address] [place], [place] [address] [phone]

## 2020-03-03 NOTE — Patient Instructions (Addendum)
Continue Flomax and oxybutynin daily for the next 7 to 10 days.  Take Zofran and Toradol as needed for nausea and pain.  We will call with PTH results and ultrasound results.   Dietary Guidelines to Help Prevent Kidney Stones Kidney stones are deposits of minerals and salts that form inside your kidneys. Your risk of developing kidney stones may be greater depending on your diet, your lifestyle, the medicines you take, and whether you have certain medical conditions. Most people can reduce their chances of developing kidney stones by following the instructions below. Depending on your overall health and the type of kidney stones you tend to develop, your dietitian may give you more specific instructions. What are tips for following this plan? Reading food labels  Choose foods with "no salt added" or "low-salt" labels. Limit your sodium intake to less than 1500 mg per day.  Choose foods with calcium for each meal and snack. Try to eat about 300 mg of calcium at each meal. Foods that contain 200-500 mg of calcium per serving include: ? 8 oz (237 ml) of milk, fortified nondairy milk, and fortified fruit juice. ? 8 oz (237 ml) of kefir, yogurt, and soy yogurt. ? 4 oz (118 ml) of tofu. ? 1 oz of cheese. ? 1 cup (300 g) of dried figs. ? 1 cup (91 g) of cooked broccoli. ? 1-3 oz can of sardines or mackerel.  Most people need 1000 to 1500 mg of calcium each day. Talk to your dietitian about how much calcium is recommended for you. Shopping  Buy plenty of fresh fruits and vegetables. Most people do not need to avoid fruits and vegetables, even if they contain nutrients that may contribute to kidney stones.  When shopping for convenience foods, choose: ? Whole pieces of fruit. ? Premade salads with dressing on the side. ? Low-fat fruit and yogurt smoothies.  Avoid buying frozen meals or prepared deli foods.  Look for foods with live cultures, such as yogurt and kefir. Cooking  Do not add  salt to food when cooking. Place a salt shaker on the table and allow each person to add his or her own salt to taste.  Use vegetable protein, such as beans, textured vegetable protein (TVP), or tofu instead of meat in pasta, casseroles, and soups. Meal planning   Eat less salt, if told by your dietitian. To do this: ? Avoid eating processed or premade food. ? Avoid eating fast food.  Eat less animal protein, including cheese, meat, poultry, or fish, if told by your dietitian. To do this: ? Limit the number of times you have meat, poultry, fish, or cheese each week. Eat a diet free of meat at least 2 days a week. ? Eat only one serving each day of meat, poultry, fish, or seafood. ? When you prepare animal protein, cut pieces into small portion sizes. For most meat and fish, one serving is about the size of one deck of cards.  Eat at least 5 servings of fresh fruits and vegetables each day. To do this: ? Keep fruits and vegetables on hand for snacks. ? Eat 1 piece of fruit or a handful of berries with breakfast. ? Have a salad and fruit at lunch. ? Have two kinds of vegetables at dinner.  Limit foods that are high in a substance called oxalate. These include: ? Spinach. ? Rhubarb. ? Beets. ? Potato chips and french fries. ? Nuts.  If you regularly take a diuretic medicine, make sure  to eat at least 1-2 fruits or vegetables high in potassium each day. These include: ? Avocado. ? Banana. ? Orange, prune, carrot, or tomato juice. ? Baked potato. ? Cabbage. ? Beans and split peas. General instructions   Drink enough fluid to keep your urine clear or pale yellow. This is the most important thing you can do.  Talk to your health care provider and dietitian about taking daily supplements. Depending on your health and the cause of your kidney stones, you may be advised: ? Not to take supplements with vitamin C. ? To take a calcium supplement. ? To take a daily probiotic  supplement. ? To take other supplements such as magnesium, fish oil, or vitamin B6.  Take all medicines and supplements as told by your health care provider.  Limit alcohol intake to no more than 1 drink a day for nonpregnant women and 2 drinks a day for men. One drink equals 12 oz of beer, 5 oz of wine, or 1 oz of hard liquor.  Lose weight if told by your health care provider. Work with your dietitian to find strategies and an eating plan that works best for you. What foods are not recommended? Limit your intake of the following foods, or as told by your dietitian. Talk to your dietitian about specific foods you should avoid based on the type of kidney stones and your overall health. Grains Breads. Bagels. Rolls. Baked goods. Salted crackers. Cereal. Pasta. Vegetables Spinach. Rhubarb. Beets. Canned vegetables. Rosita Fire. Olives. Meats and other protein foods Nuts. Nut butters. Large portions of meat, poultry, or fish. Salted or cured meats. Deli meats. Hot dogs. Sausages. Dairy Cheese. Beverages Regular soft drinks. Regular vegetable juice. Seasonings and other foods Seasoning blends with salt. Salad dressings. Canned soups. Soy sauce. Ketchup. Barbecue sauce. Canned pasta sauce. Casseroles. Pizza. Lasagna. Frozen meals. Potato chips. [place] fries. Summary  You can reduce your risk of kidney stones by making changes to your diet.  The most important thing you can do is drink enough fluid. You should drink enough fluid to keep your urine clear or pale yellow.  Ask your health care provider or dietitian how much protein from animal sources you should eat each day, and also how much salt and calcium you should have each day. This information is not intended to replace advice given to you by your health care provider. Make sure you discuss any questions you have with your health care provider. Document Revised: 07/09/2018 Document Reviewed: 02/28/2016 Elsevier Patient Education  2020  [employer].

## 2020-03-04 LAB — PTH, INTACT AND CALCIUM
Calcium: 10.3 mg/dL — ABNORMAL HIGH (ref 8.7–10.2)
PTH: 11 pg/mL — ABNORMAL LOW (ref 15–65)

## 2020-03-10 ENCOUNTER — Other Ambulatory Visit: Payer: Self-pay

## 2020-03-10 ENCOUNTER — Telehealth: Payer: Self-pay | Admitting: *Deleted

## 2020-03-10 ENCOUNTER — Ambulatory Visit (HOSPITAL_COMMUNITY): Payer: Medicaid Other | Admitting: Clinical

## 2020-03-10 NOTE — Telephone Encounter (Signed)
Patient called Triage regarding Lab results from 03/03/20, would like clarification. Please advise.

## 2020-03-11 NOTE — Telephone Encounter (Signed)
Patient advised, voiced understanding. Placed referral.

## 2020-03-11 NOTE — Telephone Encounter (Signed)
-----   Message from Sondra Come, MD sent at 03/11/2020 11:06 AM EST ----- She does not have hyperparathyroidism as a cause of her kidney stones.  Her values were not completely normal though, and I would recommend referral to endocrinology for further evaluation.  Okay to place endocrinology referral for hypercalcemia.  Thanks Legrand Rams, MD 03/11/2020

## 2020-03-21 ENCOUNTER — Other Ambulatory Visit: Payer: Self-pay

## 2020-03-21 ENCOUNTER — Ambulatory Visit (HOSPITAL_COMMUNITY): Payer: Medicaid Other | Admitting: Clinical

## 2020-03-29 ENCOUNTER — Ambulatory Visit
Admission: RE | Admit: 2020-03-29 | Discharge: 2020-03-29 | Disposition: A | Discharge status: Home / Self Care | Payer: Medicaid Other | Source: Ambulatory Visit | Attending: Urology | Admitting: Urology

## 2020-03-29 ENCOUNTER — Other Ambulatory Visit: Payer: Self-pay

## 2020-03-29 DIAGNOSIS — Z466 Encounter for fitting and adjustment of urinary device: Secondary | ICD-10-CM | POA: Insufficient documentation

## 2020-03-30 ENCOUNTER — Telehealth: Payer: Self-pay

## 2020-03-30 NOTE — Telephone Encounter (Signed)
See mychart documentation. Litholink ordered.

## 2020-03-30 NOTE — Telephone Encounter (Signed)
-----   Message from Sondra Come, MD sent at 03/30/2020  8:20 AM EST ----- [place] looks great, no hydronephrosis and everything has healed well. Recommend 24 hour urine ~4 weeks and virtual visit to discuss results  Legrand Rams, MD 03/30/2020

## 2020-03-30 NOTE — Telephone Encounter (Signed)
The CT with contrast that you had recently in the ER is a much better test for looking at the kidneys than the ultrasound, and there was no cyst on the CT so nothing to worry about.  The ultrasound does not give [place] as good of a picture as the CT, but nothing to worry about based on the recent CT findings  Legrand Rams, MD 03/30/2020

## 2020-05-10 ENCOUNTER — Other Ambulatory Visit: Payer: Self-pay

## 2020-05-10 ENCOUNTER — Telehealth: Payer: Medicaid Other | Admitting: Urology

## 2020-05-18 ENCOUNTER — Telehealth: Payer: Self-pay

## 2020-05-18 NOTE — Telephone Encounter (Signed)
Patient called and discussed 24 hour urine collection instructions in detail.  Questions of collection time and volume and preservative were answered. Patient verbalized understanding

## 2020-05-19 ENCOUNTER — Other Ambulatory Visit: Payer: Self-pay

## 2020-05-19 ENCOUNTER — Other Ambulatory Visit: Payer: Medicaid Other

## 2020-05-19 ENCOUNTER — Ambulatory Visit (INDEPENDENT_AMBULATORY_CARE_PROVIDER_SITE_OTHER): Payer: Medicaid Other | Admitting: Clinical

## 2020-05-19 DIAGNOSIS — F411 Generalized anxiety disorder: Secondary | ICD-10-CM | POA: Diagnosis not present
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
## 2020-05-31 ENCOUNTER — Telehealth: Payer: Self-pay | Admitting: Urology

## 2020-06-02 ENCOUNTER — Other Ambulatory Visit: Payer: Self-pay | Admitting: Urology

## 2020-06-07 ENCOUNTER — Telehealth (INDEPENDENT_AMBULATORY_CARE_PROVIDER_SITE_OTHER): Payer: Medicaid Other | Admitting: Urology

## 2020-06-07 ENCOUNTER — Encounter: Payer: Self-pay | Admitting: Urology

## 2020-06-07 DIAGNOSIS — N200 Calculus of kidney: Secondary | ICD-10-CM | POA: Diagnosis not present

## 2020-06-07 NOTE — Progress Notes (Signed)
Virtual Visit via Telephone Note  I connected with Sheryl Tucker on 06/07/20 at 11:15 AM EST by telephone and verified that I am speaking with the correct person using two identifiers.   Patient location: Home Provider location: [facility]   I discussed the limitations, risks, security and privacy concerns of performing an evaluation and management service by telephone and the availability of in person appointments. We discussed the impact of the COVID-19 pandemic on the healthcare system, and the importance of social distancing and reducing patient and provider exposure. I also discussed with the patient that there may be a patient responsible charge related to this service. The patient expressed understanding and agreed to proceed.  Reason for visit: Nephrolithiasis, review 24 hour urine results  History of Present Illness: 25 year old female with extensive family history of kidney stones who underwent right ureteroscopy and laser lithotripsy November 2021 for a 4 mm distal ureteral stone.  Completed a 24-hour urine test.  24-hour urine results notable for low urine volume of 1.06 L borderline urine calcium of 192, normal urine oxalate of 26, excellent urine citrate of 756, pH 6.9, elevated sodium of 233.  Stone type was calcium oxalate.  Her primary risk factors are low urine volume, and elevated sodium in the urine resulting in increased calcium.  We reviewed these results at length.   Follow Up: RTC 9 months with [facility] prior   I discussed the assessment and treatment plan with the patient. The patient was provided an opportunity to ask questions and all were answered. The patient agreed with the plan and demonstrated an understanding of the instructions.   The patient was advised to call back or seek an in-person evaluation if the symptoms worsen or if the condition fails to improve as anticipated.  I provided 11 minutes of non-face-to-face time during this  encounter.   Sondra Come, MD

## 2020-06-09 ENCOUNTER — Other Ambulatory Visit: Payer: Self-pay

## 2020-06-09 ENCOUNTER — Ambulatory Visit (INDEPENDENT_AMBULATORY_CARE_PROVIDER_SITE_OTHER): Payer: Medicaid Other | Admitting: Clinical

## 2020-06-09 DIAGNOSIS — F331 Major depressive disorder, recurrent, moderate: Secondary | ICD-10-CM | POA: Diagnosis not present

## 2020-06-11 NOTE — Progress Notes (Signed)
   THERAPIST PROGRESS NOTE  Session Time: 35 minutes  Participation Level: Active  Behavioral Response: CasualAlertEuthymic  Type of Therapy: Individual Therapy  Treatment Goals addressed: Anxiety  Interventions: CBT  Summary:  Sheryl Tucker is a 25 y.o. female who presents for the scheduled session oriented times five, appropriately dressed, and friendly. Client denied hallucinations and delusions. Client reported on today she is doing well. 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 Client reported her interactions with management have been better. Client reported she is looking forward to her trip with her best friend to [place] to get away. Client reported her maternal grandmother is coming from [place] to live closer to them "until she does". Client reported her health hasn't been good in recent years. Client reported otherwise she has been thinking about continuing to loss weight. Client reported her method of eating one meal a day or snacking has lead to good weight loss in her opinion. Client reported she had hidden behind clothes for a long time and wants to feel comfortable in herself. Client reported she has had the mindset of "the more weight I lose the harder it will be to gain it back". Client discussed understanding that mindset is not ideal but she has gotten her desired results from her methods.    Suicidal/Homicidal: Nowithout intent/plan  Therapist Response:  Therapist inquired about how the client has been doing since last seen. Therapist actively listened to the clients thoughts and feelings. Therapist used CBT to engage with the client to challenge her beliefs to possible alternate healthier perspective. Therapist assigned the client homework of reading a psychoeducation worksheet about evaluating negative evaluations to discuss at the next session. Client was scheduled for next  appointment.    Plan: Return again in 4 weeks for individual therapy.  Diagnosis: Major depressive disorder, recurrent episode, moderate w/ anxious distress  Neena Rhymes Cozart, LCSW 06/09/2020

## 2020-06-22 ENCOUNTER — Other Ambulatory Visit: Payer: Self-pay

## 2020-06-22 ENCOUNTER — Ambulatory Visit (INDEPENDENT_AMBULATORY_CARE_PROVIDER_SITE_OTHER): Payer: Medicaid Other | Admitting: Psychiatry

## 2020-06-22 ENCOUNTER — Encounter (HOSPITAL_COMMUNITY): Payer: Self-pay | Admitting: Psychiatry

## 2020-06-22 DIAGNOSIS — F411 Generalized anxiety disorder: Secondary | ICD-10-CM

## 2020-06-22 MED ORDER — HYDROXYZINE HCL 10 MG PO TABS: 10.0000 mg | ORAL_TABLET | Freq: Three times a day (TID) | ORAL | 2 refills | Status: DC | PRN
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
## 2020-07-07 ENCOUNTER — Ambulatory Visit (HOSPITAL_COMMUNITY): Payer: Medicaid Other | Admitting: Clinical

## 2020-07-27 ENCOUNTER — Ambulatory Visit (HOSPITAL_COMMUNITY): Payer: Medicaid Other | Admitting: Clinical

## 2020-09-15 ENCOUNTER — Encounter (HOSPITAL_COMMUNITY): Payer: Self-pay | Admitting: Psychiatry

## 2020-09-15 ENCOUNTER — Other Ambulatory Visit: Payer: Self-pay

## 2020-09-15 ENCOUNTER — Telehealth (INDEPENDENT_AMBULATORY_CARE_PROVIDER_SITE_OTHER): Payer: Medicaid Other | Admitting: Psychiatry

## 2020-09-15 DIAGNOSIS — F411 Generalized anxiety disorder: Secondary | ICD-10-CM

## 2020-09-15 DIAGNOSIS — F332 Major depressive disorder, recurrent severe without psychotic features: Secondary | ICD-10-CM | POA: Diagnosis not present
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
## 2020-10-11 ENCOUNTER — Emergency Department
Admission: EM | Admit: 2020-10-11 | Discharge: 2020-10-11 | Disposition: A | Discharge status: Home / Self Care | Payer: Medicaid Other | Attending: Emergency Medicine | Admitting: Emergency Medicine

## 2020-10-11 ENCOUNTER — Emergency Department: Payer: Medicaid Other

## 2020-10-11 ENCOUNTER — Other Ambulatory Visit: Payer: Self-pay

## 2020-10-11 DIAGNOSIS — O200 Threatened abortion: Secondary | ICD-10-CM | POA: Diagnosis not present

## 2020-10-11 DIAGNOSIS — O209 Hemorrhage in early pregnancy, unspecified: Secondary | ICD-10-CM

## 2020-10-11 DIAGNOSIS — R1031 Right lower quadrant pain: Secondary | ICD-10-CM | POA: Diagnosis not present

## 2020-10-11 DIAGNOSIS — E039 Hypothyroidism, unspecified: Secondary | ICD-10-CM | POA: Diagnosis not present

## 2020-10-11 DIAGNOSIS — Z3A01 Less than 8 weeks gestation of pregnancy: Secondary | ICD-10-CM | POA: Diagnosis not present

## 2020-10-11 LAB — CBC
HCT: 41.2 % (ref 36.0–46.0)
Hemoglobin: 13.5 g/dL (ref 12.0–15.0)
MCH: 27.6 pg (ref 26.0–34.0)
MCHC: 32.8 g/dL (ref 30.0–36.0)
MCV: 84.1 fL (ref 80.0–100.0)
Platelets: 332 10*3/uL (ref 150–400)
RBC: 4.9 MIL/uL (ref 3.87–5.11)
RDW: 13.6 % (ref 11.5–15.5)
WBC: 11.3 10*3/uL — ABNORMAL HIGH (ref 4.0–10.5)
nRBC: 0 % (ref 0.0–0.2)

## 2020-10-11 LAB — URINALYSIS, COMPLETE (UACMP) WITH MICROSCOPIC
Bilirubin Urine: NEGATIVE
Glucose, UA: NEGATIVE mg/dL
Ketones, ur: NEGATIVE mg/dL
Leukocytes,Ua: NEGATIVE
Nitrite: NEGATIVE
Protein, ur: NEGATIVE mg/dL
RBC / HPF: >50 RBC/hpf — ABNORMAL HIGH (ref 0–5)
Specific Gravity, Urine: 1.017 (ref 1.005–1.030)
pH: 6 (ref 5.0–8.0)

## 2020-10-11 LAB — COMPREHENSIVE METABOLIC PANEL
ALT: 14 U/L (ref 0–44)
AST: 14 U/L — ABNORMAL LOW (ref 15–41)
Albumin: 3.9 g/dL (ref 3.5–5.0)
Alkaline Phosphatase: 67 U/L (ref 38–126)
Anion gap: 4 — ABNORMAL LOW (ref 5–15)
BUN: 10 mg/dL (ref 6–20)
CO2: 26 mmol/L (ref 22–32)
Calcium: 9.3 mg/dL (ref 8.9–10.3)
Chloride: 105 mmol/L (ref 98–111)
Creatinine, Ser: 0.6 mg/dL (ref 0.44–1.00)
GFR, Estimated: >60 mL/min (ref >60)
Glucose, Bld: 89 mg/dL (ref 70–99)
Potassium: 4.4 mmol/L (ref 3.5–5.1)
Sodium: 135 mmol/L (ref 135–145)
Total Bilirubin: 0.6 mg/dL (ref 0.3–1.2)
Total Protein: 7.5 g/dL (ref 6.5–8.1)

## 2020-10-11 LAB — HCG, QUANTITATIVE, PREGNANCY: hCG, Beta Chain, Quant, S: 1306 m[IU]/mL — ABNORMAL HIGH (ref <5)

## 2020-10-11 LAB — ABO/RH: ABO/RH(D): A POS

## 2020-10-11 NOTE — ED Triage Notes (Signed)
Pt states she is pregnant and started having spotting and some mild abd cramping today. States LMP 08/23/2020..states she found out when she was scheduled for ankle surgery 7/7 and she had a positive pregnancy test

## 2020-10-11 NOTE — ED Notes (Signed)
Issacs, MD at bedside. 

## 2020-10-11 NOTE — ED Triage Notes (Signed)
First Nurse Note:  Arrives c/o vaginal bleeding.  States recently had + preg test.  LMP:  08/23/2020-- History of PCOS.

## 2020-10-11 NOTE — ED Notes (Signed)
Pt attempted to sign for d/c but topaz froze.  

## 2020-10-11 NOTE — ED Notes (Signed)
Patient to US at this time.
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
## 2020-10-13 LAB — URINE CULTURE: Culture: <10000 — AB

## 2020-10-17 ENCOUNTER — Other Ambulatory Visit: Payer: Self-pay

## 2020-10-17 ENCOUNTER — Ambulatory Visit (INDEPENDENT_AMBULATORY_CARE_PROVIDER_SITE_OTHER): Payer: Medicaid Other | Admitting: Obstetrics

## 2020-10-17 DIAGNOSIS — O200 Threatened abortion: Secondary | ICD-10-CM

## 2020-10-17 DIAGNOSIS — O209 Hemorrhage in early pregnancy, unspecified: Secondary | ICD-10-CM | POA: Diagnosis not present

## 2020-10-18 LAB — BETA HCG QUANT (REF LAB): hCG Quant: 1039 m[IU]/mL

## 2020-10-20 ENCOUNTER — Encounter: Payer: Self-pay | Admitting: Obstetrics
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
## 2020-10-20 NOTE — Progress Notes (Signed)
Called the pateint to relay her lab results. Her Hcg has dropped 300 pts approimately over the last 6 days. Advised the pateint to RTC next weekf or repeat [employer] and to see MD if possible , in the event they would like to do an ultrasound. Her ED scan might need follow up. Mirna Mires, CNM  10/20/2020 10:15 AM

## 2020-10-21 ENCOUNTER — Other Ambulatory Visit: Payer: Self-pay

## 2020-10-21 ENCOUNTER — Other Ambulatory Visit: Payer: Medicaid Other

## 2020-10-21 DIAGNOSIS — O200 Threatened abortion: Secondary | ICD-10-CM

## 2020-10-21 DIAGNOSIS — O209 Hemorrhage in early pregnancy, unspecified: Secondary | ICD-10-CM

## 2020-10-22 LAB — BETA HCG QUANT (REF LAB): hCG Quant: 826 m[IU]/mL

## 2020-10-24 ENCOUNTER — Other Ambulatory Visit: Payer: Self-pay | Admitting: Obstetrics

## 2020-10-24 ENCOUNTER — Other Ambulatory Visit: Payer: Self-pay

## 2020-10-24 ENCOUNTER — Ambulatory Visit
Admission: RE | Admit: 2020-10-24 | Discharge: 2020-10-24 | Disposition: A | Discharge status: Home / Self Care | Payer: Medicaid Other | Source: Ambulatory Visit | Attending: Obstetrics | Admitting: Obstetrics

## 2020-10-24 DIAGNOSIS — O200 Threatened abortion: Secondary | ICD-10-CM

## 2020-10-24 DIAGNOSIS — O209 Hemorrhage in early pregnancy, unspecified: Secondary | ICD-10-CM | POA: Diagnosis present

## 2020-10-24 NOTE — Progress Notes (Signed)
[id number]  

## 2020-10-24 NOTE — Progress Notes (Signed)
Patient called.  Patient aware. We have ordered an ultrasound to confirm SAB. She had planned to have ankle surgery, and had to postpone this in light of a positive pregnancy test. Order placed for outpatient sono this week at [facility]. Discussed follow up for some repeat quant levels once the sono report is back. Mirna Mires, CNM  10/24/2020 4:38 PM

## 2020-10-25 ENCOUNTER — Other Ambulatory Visit: Payer: Self-pay | Admitting: Obstetrics

## 2020-10-25 DIAGNOSIS — O200 Threatened abortion: Secondary | ICD-10-CM

## 2020-10-25 DIAGNOSIS — O209 Hemorrhage in early pregnancy, unspecified: Secondary | ICD-10-CM

## 2020-11-04 ENCOUNTER — Other Ambulatory Visit: Payer: Medicaid Other

## 2020-11-04 ENCOUNTER — Other Ambulatory Visit: Payer: Self-pay

## 2020-11-04 ENCOUNTER — Other Ambulatory Visit: Payer: Self-pay | Admitting: Obstetrics

## 2020-11-04 DIAGNOSIS — O200 Threatened abortion: Secondary | ICD-10-CM

## 2020-11-04 DIAGNOSIS — O209 Hemorrhage in early pregnancy, unspecified: Secondary | ICD-10-CM

## 2020-11-04 NOTE — Progress Notes (Signed)
Have treid to contact this pateint to encourage her to get the repeat quantitative HcG drawn. Reached her this morning. She was to have ankle surgery, which was postponed due to her + pregnancy. Based on two ultrasounds and a drop in her quant levels we suspect this represents a likely SAB, however I have stressed to the patient that she needs repeat quants to confirm. She plans to have another quant drawn today. I reached her by phone and told her she needs to call the office before going there to be sure she has a lab draw appointment. Mirna Mires, CNM  11/04/2020 12:05 PM

## 2020-11-08 ENCOUNTER — Other Ambulatory Visit: Payer: Medicaid Other

## 2020-11-10 ENCOUNTER — Other Ambulatory Visit: Payer: Medicaid Other

## 2020-11-10 ENCOUNTER — Other Ambulatory Visit: Payer: Self-pay

## 2020-11-10 ENCOUNTER — Other Ambulatory Visit: Payer: Self-pay | Admitting: Obstetrics & Gynecology

## 2020-11-10 DIAGNOSIS — O209 Hemorrhage in early pregnancy, unspecified: Secondary | ICD-10-CM

## 2020-11-11 LAB — BETA HCG QUANT (REF LAB): hCG Quant: 135 m[IU]/mL

## 2020-11-11 NOTE — Progress Notes (Signed)
It will come down slowly til it reaches zero, amy or may not be neg by the 18th.  We cannot control that

## 2020-11-11 NOTE — Progress Notes (Signed)
Pt states she still has a positive test and she can not have a positive test on the 18th.. please advise

## 2020-11-11 NOTE — Progress Notes (Signed)
Let patient know pregnancy hormone level coming down consistent with miscarriage.  Monitor bleeding, no other course of action needed (no meds or procedures).

## 2020-11-26 LAB — BETA HCG QUANT (REF LAB)

## 2020-12-23 ENCOUNTER — Other Ambulatory Visit: Payer: Self-pay | Admitting: Obstetrics & Gynecology

## 2020-12-23 DIAGNOSIS — O200 Threatened abortion: Secondary | ICD-10-CM

## 2021-01-25 ENCOUNTER — Other Ambulatory Visit: Payer: Self-pay

## 2021-01-25 ENCOUNTER — Other Ambulatory Visit (HOSPITAL_COMMUNITY)
Admission: RE | Admit: 2021-01-25 | Discharge: 2021-01-25 | Disposition: A | Discharge status: Home / Self Care | Payer: Medicaid Other | Source: Ambulatory Visit | Attending: Obstetrics and Gynecology | Admitting: Obstetrics and Gynecology

## 2021-01-25 ENCOUNTER — Ambulatory Visit (INDEPENDENT_AMBULATORY_CARE_PROVIDER_SITE_OTHER): Payer: Medicaid Other | Admitting: Obstetrics and Gynecology

## 2021-01-25 ENCOUNTER — Encounter: Payer: Self-pay | Admitting: Obstetrics and Gynecology

## 2021-01-25 VITALS — BP 126/84 | Ht 64.0 in | Wt 226.0 lb

## 2021-01-25 DIAGNOSIS — N921 Excessive and frequent menstruation with irregular cycle: Secondary | ICD-10-CM

## 2021-01-25 DIAGNOSIS — Z124 Encounter for screening for malignant neoplasm of cervix: Secondary | ICD-10-CM

## 2021-01-25 DIAGNOSIS — Z113 Encounter for screening for infections with a predominantly sexual mode of transmission: Secondary | ICD-10-CM | POA: Insufficient documentation

## 2021-01-25 LAB — POCT URINE PREGNANCY: Preg Test, Ur: NEGATIVE

## 2021-01-25 MED ORDER — MEDROXYPROGESTERONE ACETATE 10 MG PO TABS: 10.0000 mg | ORAL_TABLET | Freq: Every day | ORAL | 0 refills | Status: DC
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
## 2021-01-27 LAB — CYTOLOGY - PAP: Diagnosis: NEGATIVE

## 2021-01-27 LAB — CERVICOVAGINAL ANCILLARY ONLY
Chlamydia: NEGATIVE
Comment: NEGATIVE
Comment: NEGATIVE
Comment: NORMAL
Neisseria Gonorrhea: NEGATIVE
Trichomonas: NEGATIVE

## 2021-02-16 IMAGING — US US RENAL
1 series · 14 of 25 positions shown · non-contrast
Comparison: CT 02/25/2020

CLINICAL DATA: History of kidney stones

EXAM:
RENAL / URINARY TRACT ULTRASOUND COMPLETE

[Series 1: us renal · 0.28mm/px · 14 of 43 slices shown]
[im 1/43]
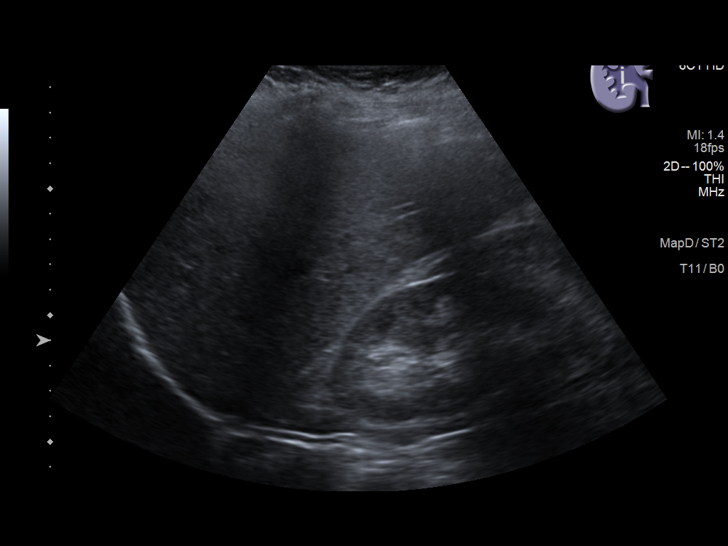
[im 4/43]
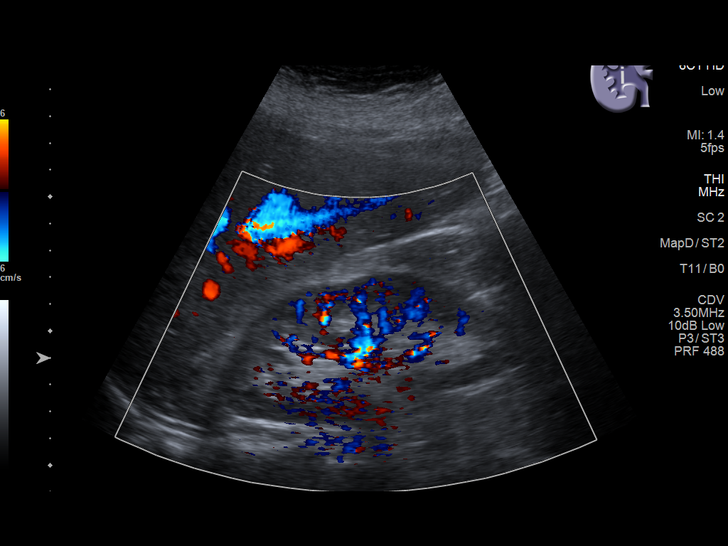
[im 8/43]
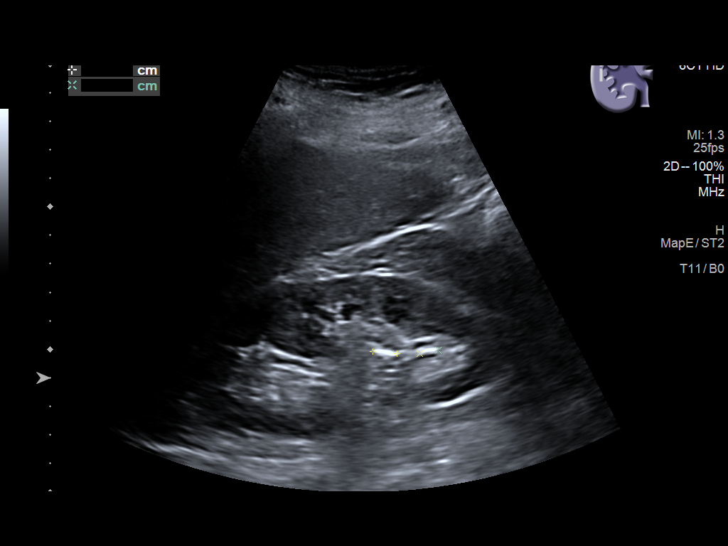
[im 11/43]
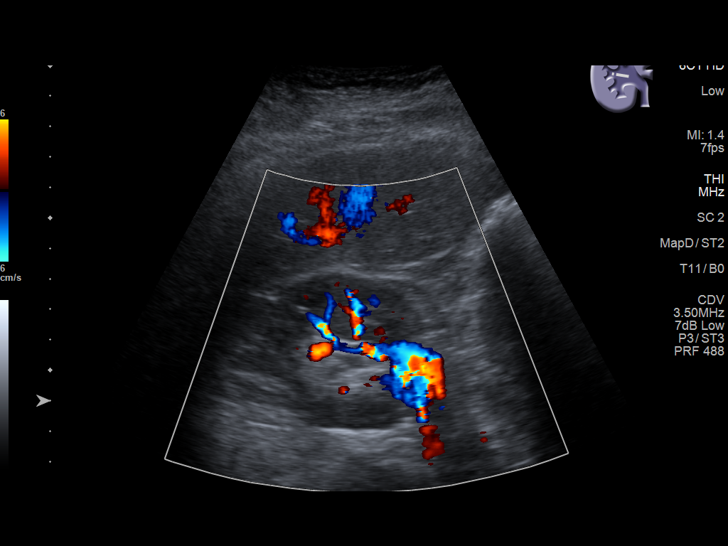
[im 15/43]
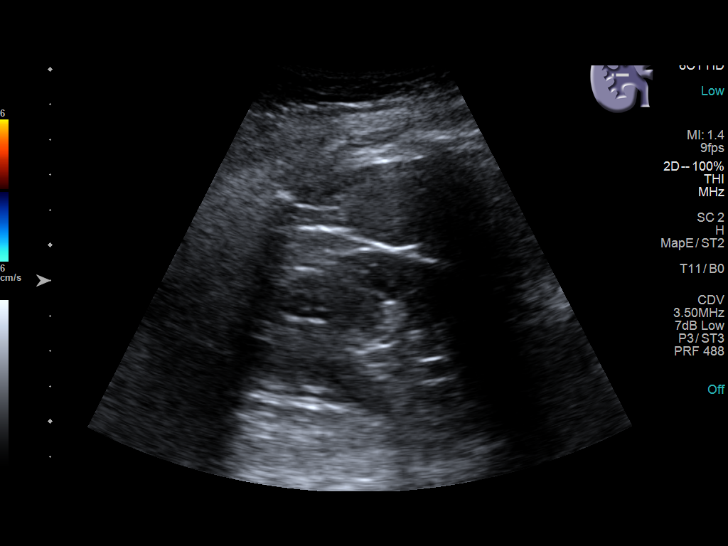
[im 16/43]
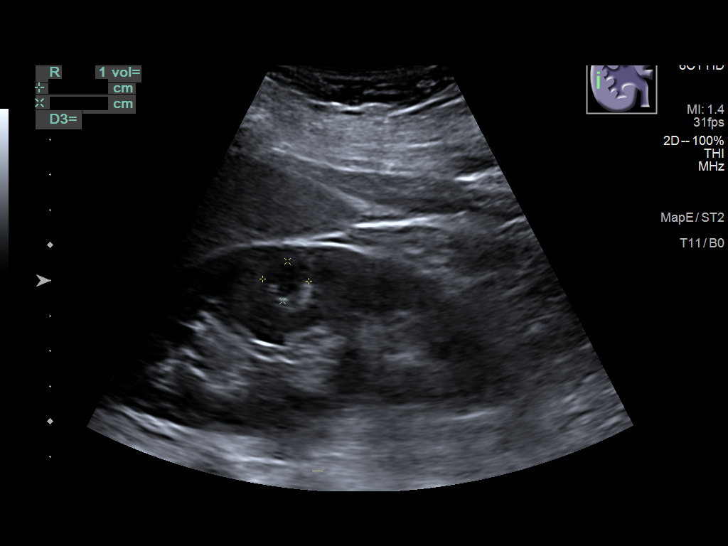
[im 20/43]
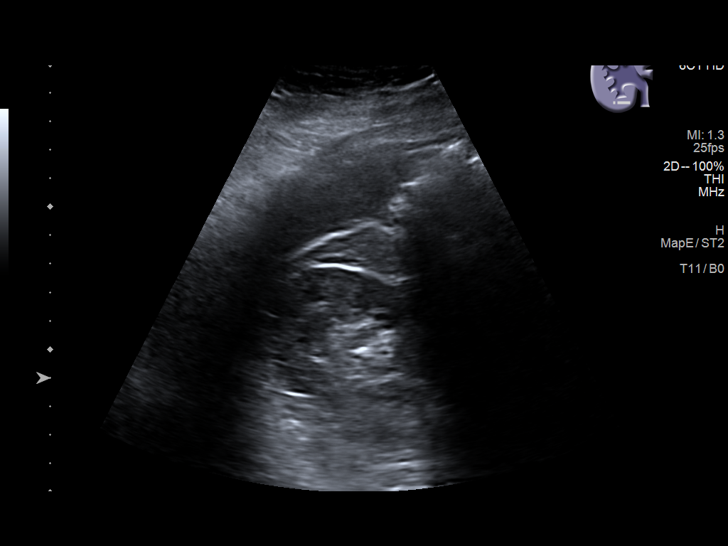
[im 23/43]
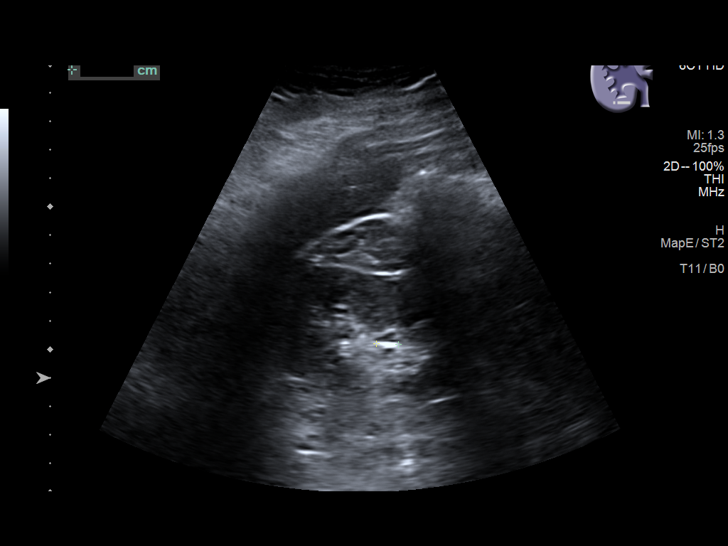
[im 27/43]
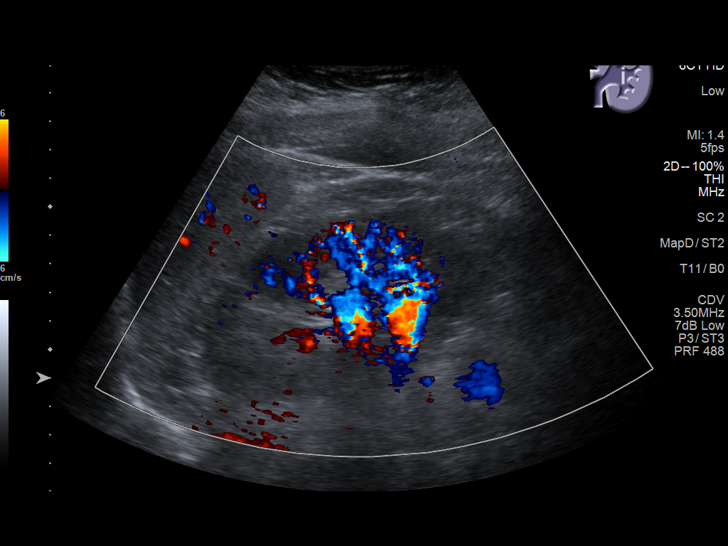
[im 29/43]
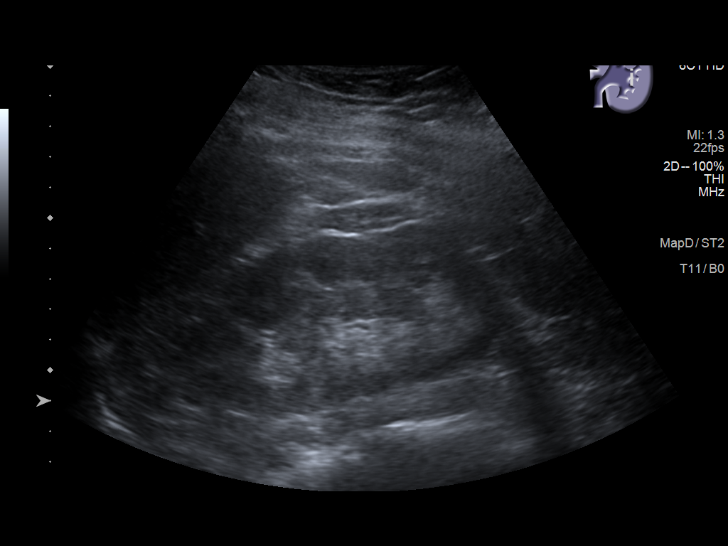
[im 32/43]
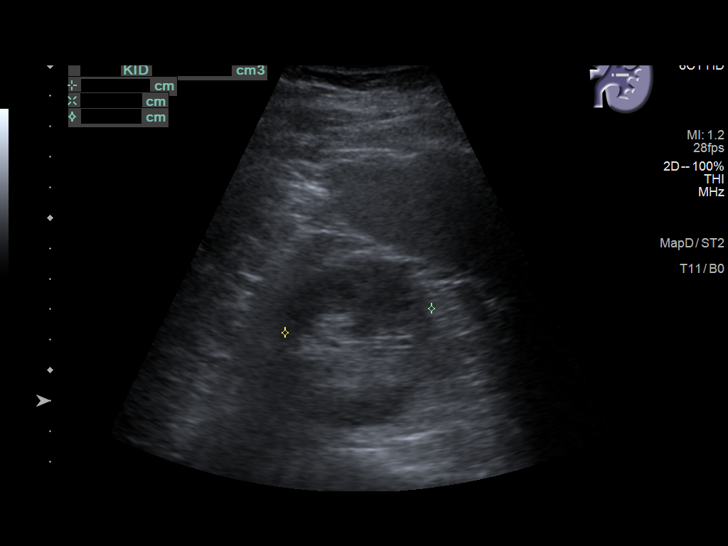
[im 36/43]
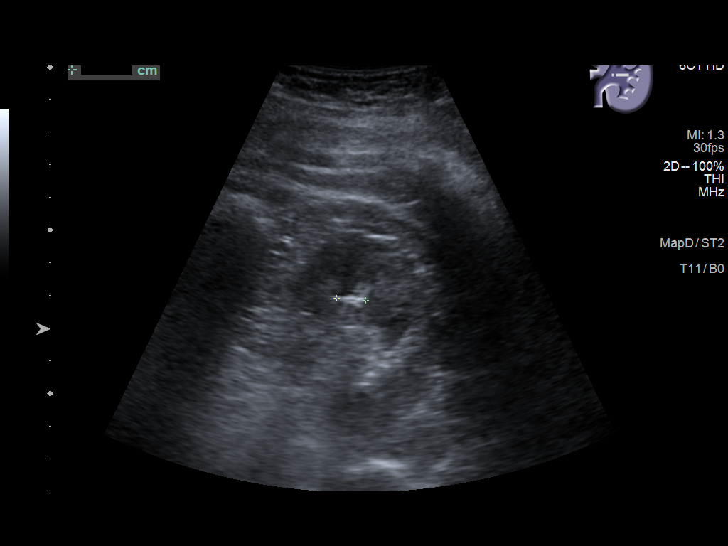
[im 39/43]
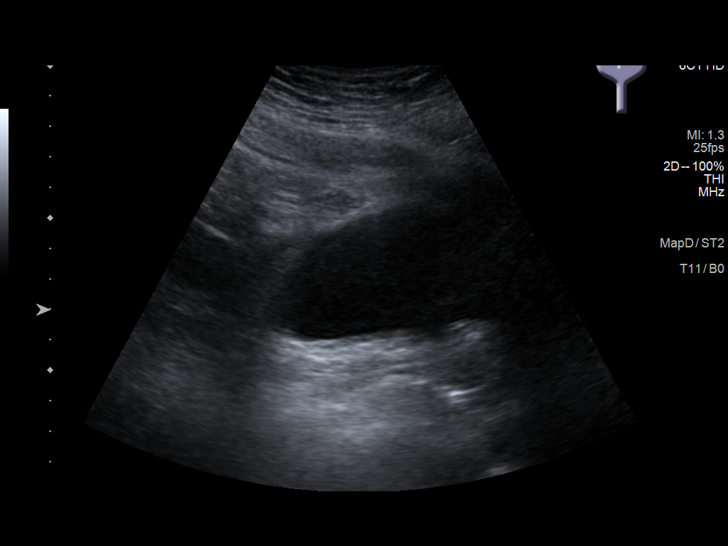
[im 43/43]
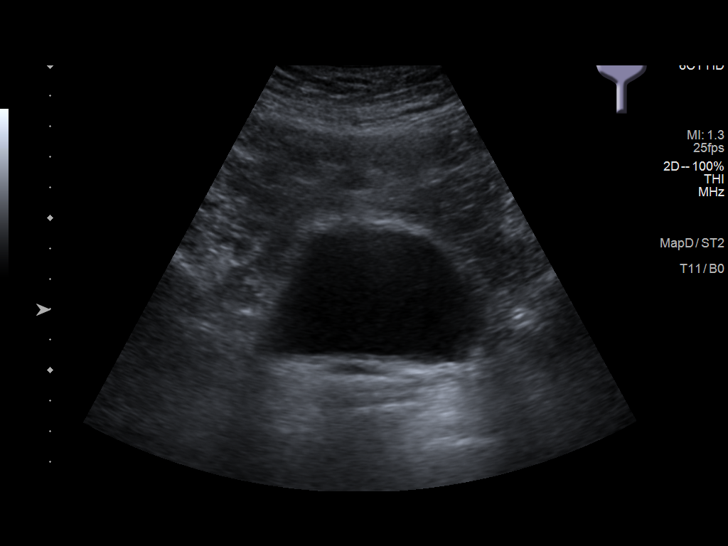

[14 of 25 positions shown; findings below may reference images not displayed]

FINDINGS: Right Kidney:

Renal measurements: 11.3 x 5.1 x 5.8 cm = volume: 175.8 mL. Cortical
echogenicity within normal limits. No hydronephrosis. Small
echogenic foci within the lower pole right kidney measuring 9 and 7
mm. Hypoechoic cyst in the midpole right kidney measuring 1.3 x
x 1.1 cm, contains scattered internal echoes.

Left Kidney:

Renal measurements: 11.4 x 5.5 x 4.9 cm = volume: 161 mL. Cortical
echogenicity is normal. No hydronephrosis. Possible small stone in
the midpole measuring 9 mm.

Bladder:

Appears normal for degree of bladder distention.

Other:

None.
IMPRESSION: 1. No hydronephrosis
2. Small bilateral echogenic foci, possible stones.
3. Mildly complicated cyst in the midpole right kidney measuring
cm, suggest ultrasound follow-up in 6 months.

## 2021-03-08 ENCOUNTER — Ambulatory Visit
Admission: RE | Admit: 2021-03-08 | Discharge: 2021-03-08 | Disposition: A | Discharge status: Home / Self Care | Payer: Medicaid Other | Attending: Urology | Admitting: Urology

## 2021-03-08 ENCOUNTER — Encounter: Payer: Self-pay | Admitting: Urology

## 2021-03-08 ENCOUNTER — Ambulatory Visit
Admission: RE | Admit: 2021-03-08 | Discharge: 2021-03-08 | Disposition: A | Discharge status: Home / Self Care | Payer: Medicaid Other | Source: Ambulatory Visit | Attending: Urology | Admitting: Urology

## 2021-03-08 ENCOUNTER — Other Ambulatory Visit: Payer: Self-pay

## 2021-03-08 ENCOUNTER — Ambulatory Visit (INDEPENDENT_AMBULATORY_CARE_PROVIDER_SITE_OTHER): Payer: Medicaid Other | Admitting: Urology

## 2021-03-08 VITALS — BP 89/68 | HR 80 | Ht 63.5 in | Wt 226.0 lb

## 2021-03-08 DIAGNOSIS — N200 Calculus of kidney: Secondary | ICD-10-CM

## 2021-03-08 NOTE — Progress Notes (Signed)
   03/08/2021 11:03 AM   [name] 22-Nov-1995 [id number]  Reason for visit: Follow up nephrolithiasis  HPI: 25 year old female with extensive family history of kidney stones who underwent right ureteroscopy and laser lithotripsy November 2021 for a 4 mm distal stone.  She also completed a 24-hour urine that was notable for low urine volume of 1.06 L borderline urine calcium of 192, normal urine oxalate of 26, excellent urine citrate of 756, pH 6.9, elevated sodium of 233.  Stone type was calcium oxalate.  PTH was low normal.  She has worked hard to cut back on salt in the diet and increased fluid intake.  She denies any stone events over the last year.  She occasionally has some "twinges" of flank and abdominal pain on either side of unclear etiology.  I personally reviewed and interpreted her KUB today that shows no obvious evidence of stone disease.  RTC 1 year KUB prior, if no evidence of stones and doing well at that time can consider as needed follow-up  Sondra Come, MD  [facility] [address] [place], [place] [address] [phone]

## 2021-03-08 NOTE — Patient Instructions (Signed)
Dietary Guidelines to Help Prevent Kidney Stones Kidney stones are deposits of minerals and salts that form inside your kidneys. Your risk of developing kidney stones may be greater depending on your diet, your lifestyle, the medicines you take, and whether you have certain medical conditions. Most people can lower their chances of developing kidney stones by following the instructions below. Your dietitian may give you more specific instructions depending on your overall health and the type of kidney stones you tend to develop. What are tips for following this plan? Reading food labels  Choose foods with "no salt added" or "low-salt" labels. Limit your salt (sodium) intake to less than 1,500 mg a day. Choose foods with calcium for each meal and snack. Try to eat about 300 mg of calcium at each meal. Foods that contain 200-500 mg of calcium a serving include: 8 oz (237 mL) of milk, calcium-fortifiednon-dairy milk, and calcium-fortifiedfruit juice. Calcium-fortified means that calcium has been added to these drinks. 8 oz (237 mL) of kefir, yogurt, and soy yogurt. 4 oz (114 g) of tofu. 1 oz (28 g) of cheese. 1 cup (150 g) of dried figs. 1 cup (91 g) of cooked broccoli. One 3 oz (85 g) can of sardines or mackerel. Most people need 1,000-1,500 mg of calcium a day. Talk to your dietitian about how much calcium is recommended for you. Shopping Buy plenty of fresh fruits and vegetables. Most people do not need to avoid fruits and vegetables, even if these foods contain nutrients that may contribute to kidney stones. When shopping for convenience foods, choose: Whole pieces of fruit. Pre-made salads with dressing on the side. Low-fat fruit and yogurt smoothies. Avoid buying frozen meals or prepared deli foods. These can be high in sodium. Look for foods with live cultures, such as yogurt and kefir. Choose high-fiber grains, such as whole-wheat breads, oat bran, and wheat cereals. Cooking Do not add  salt to food when cooking. Place a salt shaker on the table and allow each person to add his or her own salt to taste. Use vegetable protein, such as beans, textured vegetable protein (TVP), or tofu, instead of meat in pasta, casseroles, and soups. Meal planning Eat less salt, if told by your dietitian. To do this: Avoid eating processed or pre-made food. Avoid eating fast food. Eat less animal protein, including cheese, meat, poultry, or fish, if told by your dietitian. To do this: Limit the number of times you have meat, poultry, fish, or cheese each week. Eat a diet free of meat at least 2 days a week. Eat only one serving each day of meat, poultry, fish, or seafood. When you prepare animal protein, cut pieces into small portion sizes. For most meat and fish, one serving is about the size of the palm of your hand. Eat at least five servings of fresh fruits and vegetables each day. To do this: Keep fruits and vegetables on hand for snacks. Eat one piece of fruit or a handful of berries with breakfast. Have a salad and fruit at lunch. Have two kinds of vegetables at dinner. Limit foods that are high in a substance called oxalate. These include: Spinach (cooked), rhubarb, beets, sweet potatoes, and Swiss chard. Peanuts. Potato chips, french fries, and baked potatoes with skin on. Nuts and nut products. Chocolate. If you regularly take a diuretic medicine, make sure to eat at least 1 or 2 servings of fruits or vegetables that are high in potassium each day. These include: Avocado. Banana. Orange, prune,   carrot, or tomato juice. Baked potato. Cabbage. Beans and split peas. Lifestyle  Drink enough fluid to keep your urine pale yellow. This is the most important thing you can do. Spread your fluid intake throughout the day. If you drink alcohol: Limit how much you use to: 0-1 drink a day for women who are not pregnant. 0-2 drinks a day for men. Be aware of how much alcohol is in your  drink. In the [place] one drink equals one 12 oz bottle of beer (355 mL), one 5 oz glass of wine (148 mL), or one 1 oz glass of hard liquor (44 mL). Lose weight if told by your health care provider. Work with your dietitian to find an eating plan and weight loss strategies that work best for you. General information Talk to your health care provider and dietitian about taking daily supplements. You may be told the following depending on your health and the cause of your kidney stones: Not to take supplements with vitamin C. To take a calcium supplement. To take a daily probiotic supplement. To take other supplements such as magnesium, fish oil, or vitamin B6. Take over-the-counter and prescription medicines only as told by your health care provider. These include supplements. What foods should I limit? Limit your intake of the following foods, or eat them as told by your dietitian. Vegetables Spinach. Rhubarb. Beets. Canned vegetables. Pickles. Olives. Baked potatoes with skin. Grains Wheat bran. Baked goods. Salted crackers. Cereals high in sugar. Meats and other proteins Nuts. Nut butters. Large portions of meat, poultry, or fish. Salted, precooked, or cured meats, such as sausages, meat loaves, and hot dogs. Dairy Cheese. Beverages Regular soft drinks. Regular vegetable juice. Seasonings and condiments Seasoning blends with salt. Salad dressings. Soy sauce. Ketchup. Barbecue sauce. Other foods Canned soups. Canned pasta sauce. Casseroles. Pizza. Lasagna. Frozen meals. Potato chips. French fries. The items listed above may not be a complete list of foods and beverages you should limit. Contact a dietitian for more information. What foods should I avoid? Talk to your dietitian about specific foods you should avoid based on the type of kidney stones you have and your overall health. Fruits Grapefruit. The item listed above may not be a complete list of foods and beverages you should  avoid. Contact a dietitian for more information. Summary Kidney stones are deposits of minerals and salts that form inside your kidneys. You can lower your risk of kidney stones by making changes to your diet. The most important thing you can do is drink enough fluid. Drink enough fluid to keep your urine pale yellow. Talk to your dietitian about how much calcium you should have each day, and eat less salt and animal protein as told by your dietitian. This information is not intended to replace advice given to you by your health care provider. Make sure you discuss any questions you have with your health care provider. Document Revised: 03/12/2019 Document Reviewed: 03/12/2019 Elsevier Patient Education  2022 Elsevier Inc.  

## 2021-09-13 IMAGING — US US OB < 14 WEEKS - US OB TV
2 series · 15 of 28 positions shown · non-contrast
Comparison: Ultrasound 10/11/2020

CLINICAL DATA: Vaginal bleeding in early pregnancy

EXAM:
OBSTETRIC <14 WK US AND TRANSVAGINAL OB US
TECHNIQUE: Both transabdominal and transvaginal ultrasound examinations were
performed for complete evaluation of the gestation as well as the
maternal uterus, adnexal regions, and pelvic cul-de-sac.
Transvaginal technique was performed to assess early pregnancy.

[Series 1: us ob comp less 14 wks · 61 acquisitions, 14 frames shown (1 of 2)]
[im 1/61]
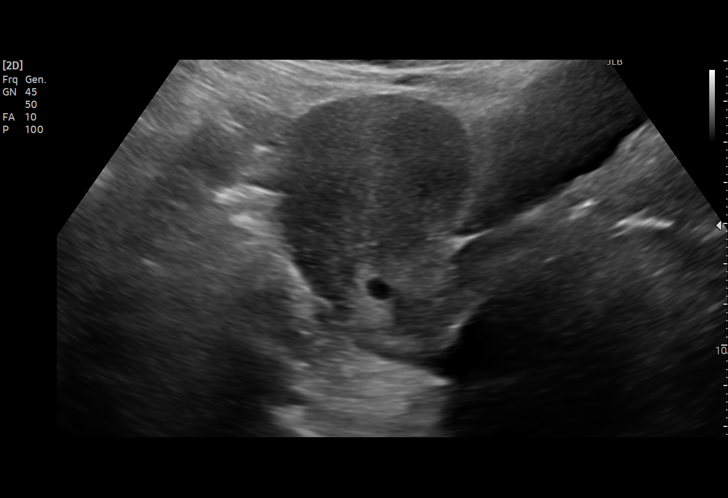
[im 5/61]
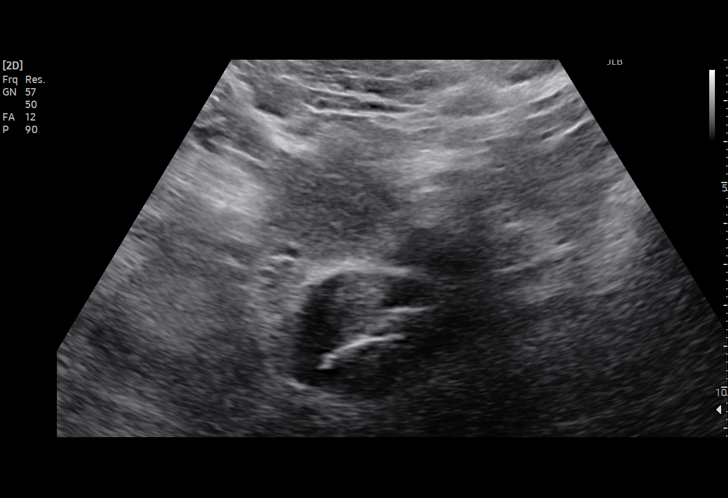
[im 10/61]
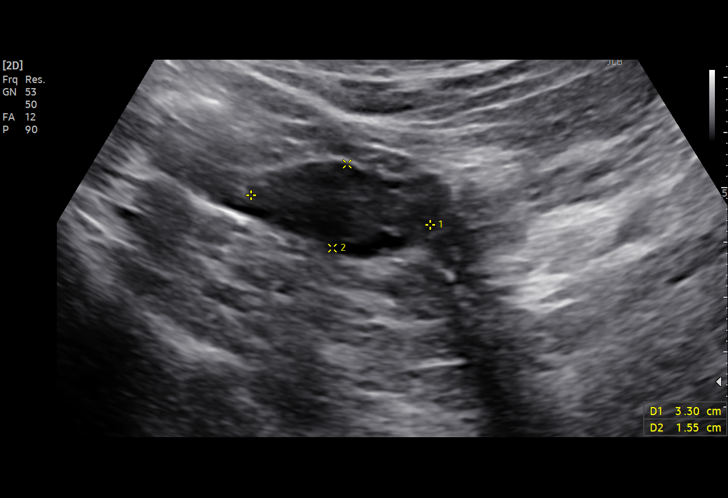
[im 14/61]
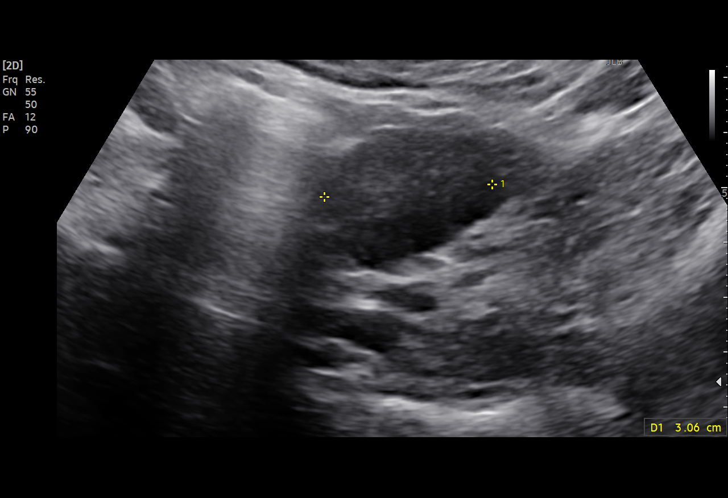
[im 19/61]
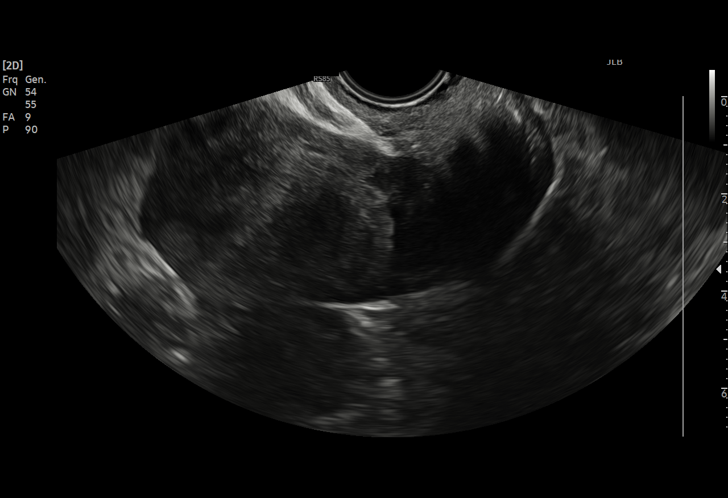
[im 24/61]
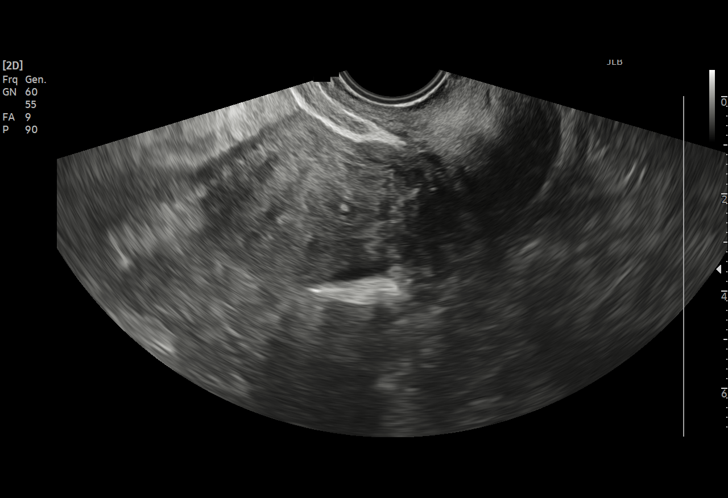
[im 28/61]
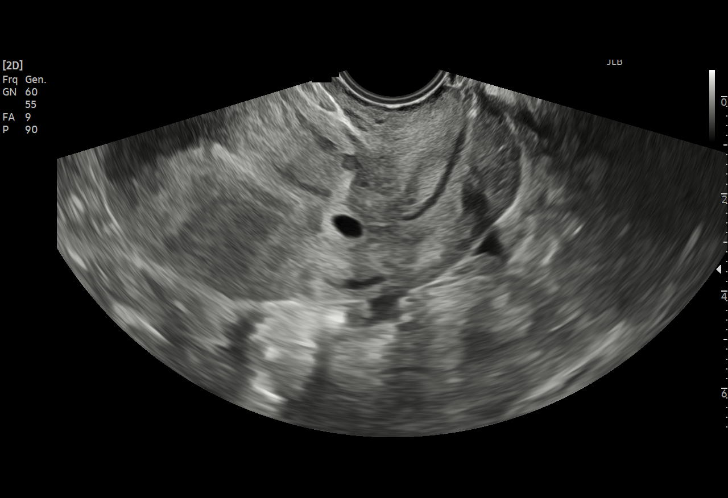
[im 33/61]
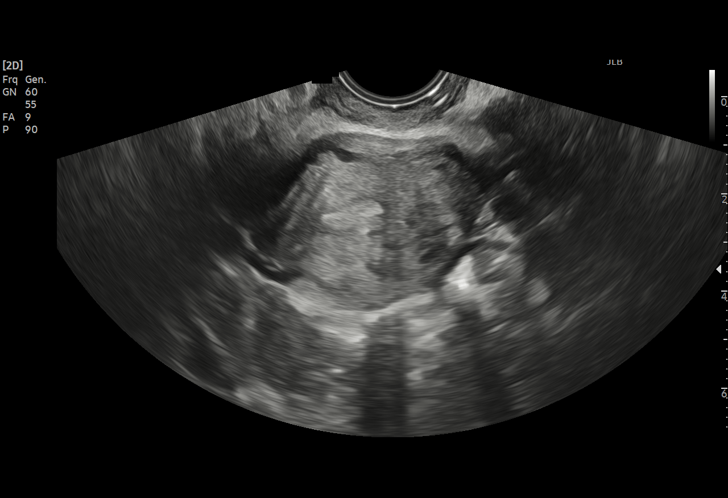
[im 35/61]
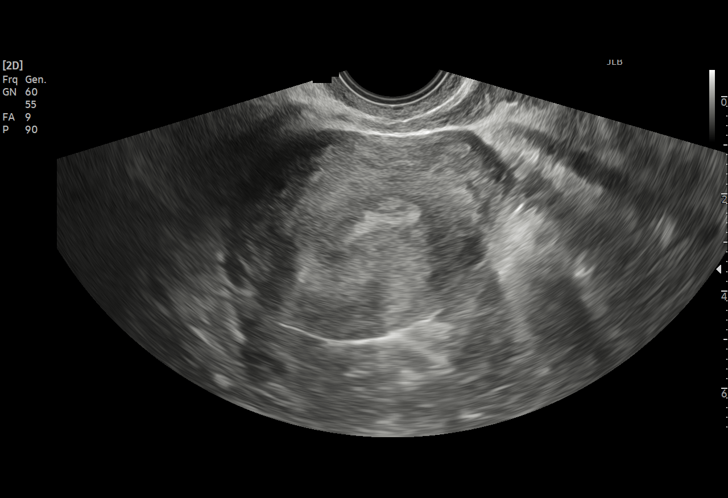
[im 40/61]
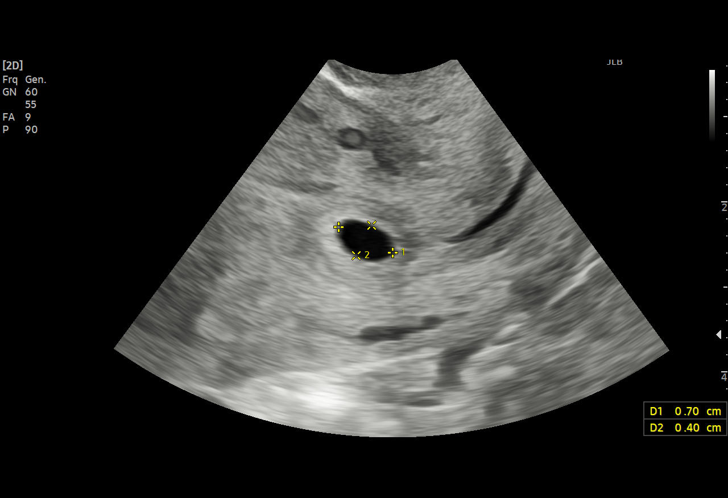
[im 44/61]
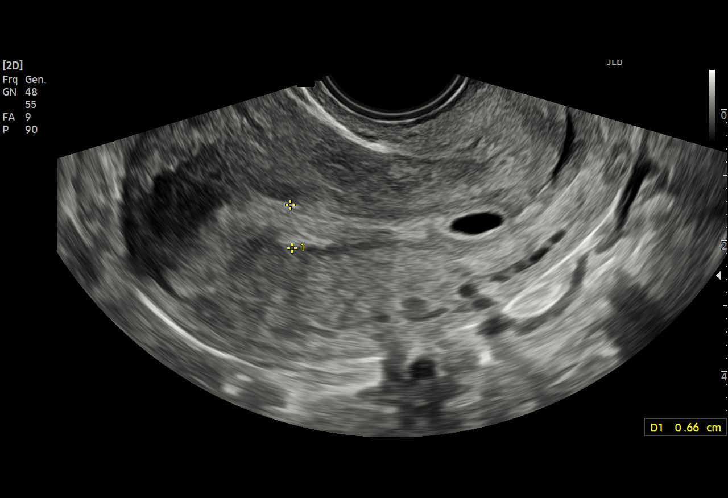
[im 49/61]
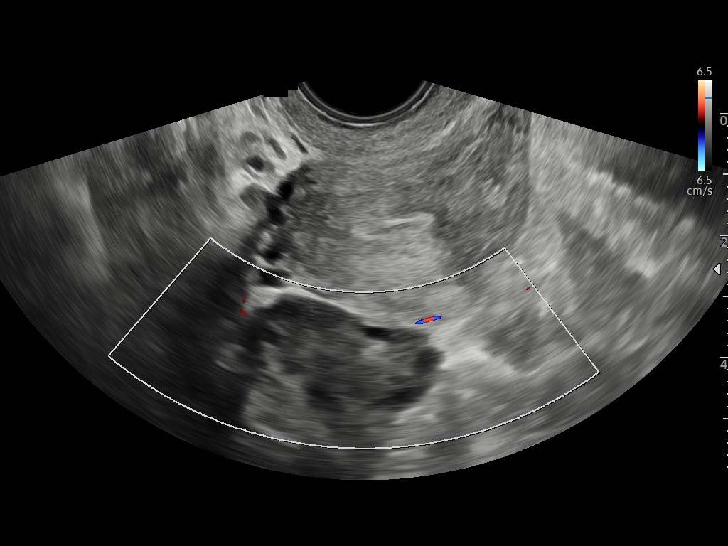
[im 54/61]
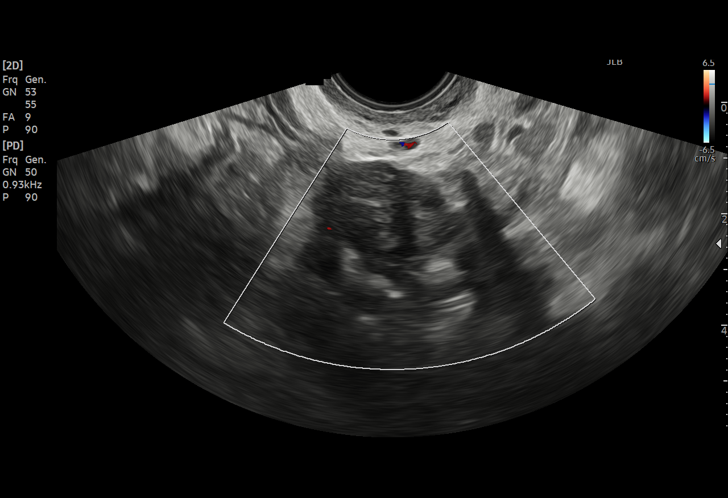
[im 58/61]
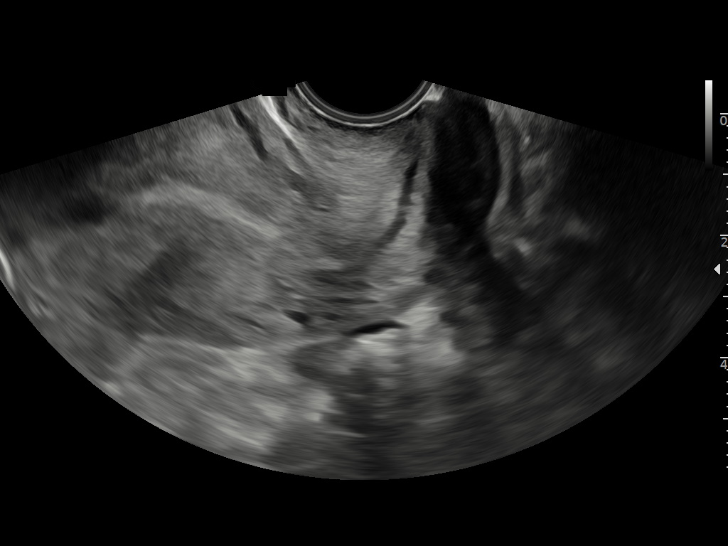

[Series 3: us ob comp less 14 wks · 1 of 1 slices shown (2 of 2)]
[im 1/1]
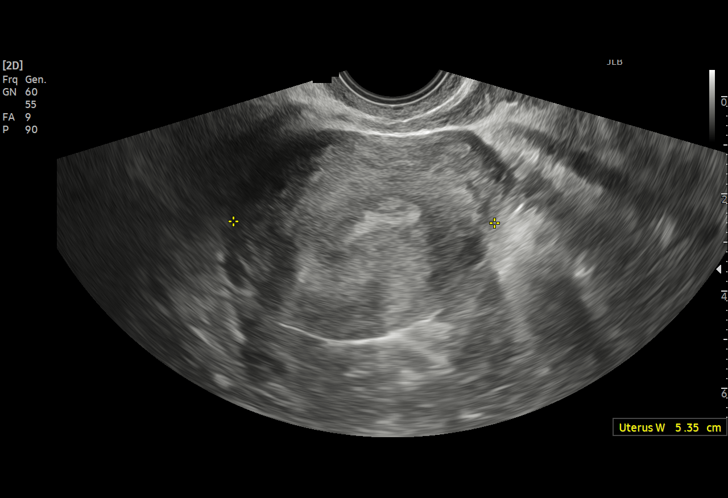

[15 of 28 positions shown; findings below may reference images not displayed]

FINDINGS: Intrauterine gestational sac: Focal fluid collection in the lower
uterine segment.

Yolk sac:  Not Visualized.

Embryo:  Not Visualized.

Cardiac Activity: Not Visualized.

Heart Rate: Not applicable

MSD: 5.79 mm   5 w   2 d

Subchorionic hemorrhage:  None visualized.

Maternal uterus/adnexae: Normal bilateral ovaries. Trace free fluid
in the pelvis.
IMPRESSION: Unchanged small fluid collection within the lower uterine segment
which could be an early gestational sac. No embryo or any cardiac
activity identified at this time. Findings are suspicious for but
not diagnostic of pregnancy failure. Recommend close clinical
follow-up with OBGYN and trending of quantitative beta HCG with
follow-up ultrasound in 7 days.

## 2021-12-25 ENCOUNTER — Encounter: Payer: Self-pay | Admitting: Urology

## 2022-03-06 ENCOUNTER — Other Ambulatory Visit: Payer: Self-pay | Admitting: *Deleted

## 2022-03-06 DIAGNOSIS — N200 Calculus of kidney: Secondary | ICD-10-CM

## 2022-03-07 ENCOUNTER — Ambulatory Visit
Admission: RE | Admit: 2022-03-07 | Discharge: 2022-03-07 | Disposition: A | Discharge status: Home / Self Care | Payer: Medicaid Other | Source: Ambulatory Visit | Attending: Urology | Admitting: Urology

## 2022-03-07 ENCOUNTER — Encounter: Payer: Self-pay | Admitting: Urology

## 2022-03-07 ENCOUNTER — Ambulatory Visit (INDEPENDENT_AMBULATORY_CARE_PROVIDER_SITE_OTHER): Payer: Medicaid Other | Admitting: Urology

## 2022-03-07 ENCOUNTER — Ambulatory Visit
Admission: RE | Admit: 2022-03-07 | Discharge: 2022-03-07 | Disposition: A | Discharge status: Home / Self Care | Payer: Medicaid Other | Attending: Urology | Admitting: Urology

## 2022-03-07 VITALS — BP 92/66 | HR 70 | Ht 64.0 in | Wt 220.0 lb

## 2022-03-07 DIAGNOSIS — Z87442 Personal history of urinary calculi: Secondary | ICD-10-CM

## 2022-03-07 DIAGNOSIS — N200 Calculus of kidney: Secondary | ICD-10-CM

## 2022-03-07 NOTE — Patient Instructions (Signed)

## 2022-03-07 NOTE — Progress Notes (Signed)
   03/07/2022 10:18 AM   Sheryl Tucker 12/19/1995 [id number]  Reason for visit: Follow up nephrolithiasis  HPI: 26 year old female with extensive family history of kidney stones who underwent right ureteroscopy and laser lithotripsy November 2021 for a 4 mm distal stone.  She also completed a 24-hour urine that was notable for low urine volume of 1.06 L borderline urine calcium of 192, normal urine oxalate of 26, excellent urine citrate of 756, pH 6.9, elevated sodium of 233.  Stone type was calcium oxalate.  PTH was low normal.  She has worked hard to cut back on salt in the diet and increased fluid intake.  She denies any stone events over the last year.    I personally viewed and reviewed her KUB today that shows a possible 2 mm right lower pole stone.  She would like to continue surveillance.  We discussed general stone prevention strategies including adequate hydration with goal of producing 2.5 L of urine daily, increasing citric acid intake, increasing calcium intake during high oxalate meals, minimizing animal protein, and decreasing salt intake. Information about dietary recommendations given today.   RTC 1 year KUB prior  Sondra Come, MD  [facility] [address] [place], [place] [address] [phone]

## 2022-03-08 ENCOUNTER — Ambulatory Visit: Payer: Medicaid Other | Admitting: Urology

## 2022-03-16 ENCOUNTER — Emergency Department (HOSPITAL_COMMUNITY)
Admission: EM | Admit: 2022-03-16 | Discharge: 2022-03-17 | Disposition: A | Discharge status: Home / Self Care | Payer: Medicaid Other | Attending: Emergency Medicine | Admitting: Emergency Medicine

## 2022-03-16 ENCOUNTER — Emergency Department (HOSPITAL_COMMUNITY): Payer: Medicaid Other

## 2022-03-16 ENCOUNTER — Other Ambulatory Visit: Payer: Self-pay

## 2022-03-16 DIAGNOSIS — R748 Abnormal levels of other serum enzymes: Secondary | ICD-10-CM | POA: Insufficient documentation

## 2022-03-16 DIAGNOSIS — R1110 Vomiting, unspecified: Secondary | ICD-10-CM | POA: Diagnosis not present

## 2022-03-16 DIAGNOSIS — R1031 Right lower quadrant pain: Secondary | ICD-10-CM | POA: Insufficient documentation

## 2022-03-16 DIAGNOSIS — R1084 Generalized abdominal pain: Secondary | ICD-10-CM

## 2022-03-16 DIAGNOSIS — K529 Noninfective gastroenteritis and colitis, unspecified: Secondary | ICD-10-CM

## 2022-03-16 DIAGNOSIS — D72829 Elevated white blood cell count, unspecified: Secondary | ICD-10-CM | POA: Diagnosis not present

## 2022-03-16 DIAGNOSIS — E039 Hypothyroidism, unspecified: Secondary | ICD-10-CM | POA: Diagnosis not present

## 2022-03-16 LAB — COMPREHENSIVE METABOLIC PANEL
ALT: 16 U/L (ref 0–44)
AST: 17 U/L (ref 15–41)
Albumin: 3.9 g/dL (ref 3.5–5.0)
Alkaline Phosphatase: 62 U/L (ref 38–126)
Anion gap: 9 (ref 5–15)
BUN: 5 mg/dL — ABNORMAL LOW (ref 6–20)
CO2: 25 mmol/L (ref 22–32)
Calcium: 9.2 mg/dL (ref 8.9–10.3)
Chloride: 104 mmol/L (ref 98–111)
Creatinine, Ser: 0.73 mg/dL (ref 0.44–1.00)
GFR, Estimated: >60 mL/min (ref >60)
Glucose, Bld: 86 mg/dL (ref 70–99)
Potassium: 3.6 mmol/L (ref 3.5–5.1)
Sodium: 138 mmol/L (ref 135–145)
Total Bilirubin: 0.6 mg/dL (ref 0.3–1.2)
Total Protein: 6.8 g/dL (ref 6.5–8.1)

## 2022-03-16 LAB — URINALYSIS, ROUTINE W REFLEX MICROSCOPIC
Bacteria, UA: NONE SEEN
Bilirubin Urine: NEGATIVE
Glucose, UA: NEGATIVE mg/dL
Hgb urine dipstick: NEGATIVE
Ketones, ur: 5 mg/dL — AB
Leukocytes,Ua: NEGATIVE
Nitrite: NEGATIVE
Protein, ur: 30 mg/dL — AB
Specific Gravity, Urine: 1.027 (ref 1.005–1.030)
pH: 5 (ref 5.0–8.0)

## 2022-03-16 LAB — I-STAT BETA HCG BLOOD, ED (MC, WL, AP ONLY): I-stat hCG, quantitative: <5 m[IU]/mL (ref <5)

## 2022-03-16 LAB — CBC
HCT: 41.3 % (ref 36.0–46.0)
Hemoglobin: 13.5 g/dL (ref 12.0–15.0)
MCH: 28.1 pg (ref 26.0–34.0)
MCHC: 32.7 g/dL (ref 30.0–36.0)
MCV: 86 fL (ref 80.0–100.0)
Platelets: 386 10*3/uL (ref 150–400)
RBC: 4.8 MIL/uL (ref 3.87–5.11)
RDW: 12.7 % (ref 11.5–15.5)
WBC: 17.2 10*3/uL — ABNORMAL HIGH (ref 4.0–10.5)
nRBC: 0 % (ref 0.0–0.2)

## 2022-03-16 LAB — LIPASE, BLOOD: Lipase: 52 U/L — ABNORMAL HIGH (ref 11–51)

## 2022-03-16 MED ORDER — ACETAMINOPHEN 325 MG PO TABS
650.0000 mg | ORAL_TABLET | Freq: Once | ORAL | Status: AC
  Administered 2022-03-16: 650 mg via ORAL
  Filled 2022-03-16: qty 2

## 2022-03-16 MED ORDER — ONDANSETRON 4 MG PO TBDP
4.0000 mg | ORAL_TABLET | Freq: Once | ORAL | Status: AC | PRN
  Administered 2022-03-16: 4 mg via ORAL
  Filled 2022-03-16: qty 1

## 2022-03-16 NOTE — ED Provider Triage Note (Signed)
Emergency Medicine Provider Triage Evaluation Note  Sheryl Tucker , a 26 y.o. female  was evaluated in triage.  Pt complains of upper abd pain, vomiting, coming in waves.  Initially started after eating spicy food.  Pt is intense causes to almost cry.  Episodes last 5-10 minutes.  .No pain now  Review of Systems  Positive: Abd pain Negative: No fever  Physical Exam  BP 114/66 (BP Location: Right Arm)   Pulse 67   Temp 98.1 F (36.7 C) (Oral)   Resp 17   LMP 01/31/2022   SpO2 97%  Gen:   Awake, no distress   Resp:  Normal effort  MSK:   Moves extremities without difficulty  Other:  Abd ttp ruq  Medical Decision Making  Medically screening exam initiated at 7:40 PM.  Appropriate orders placed.  Sheryl Tucker was informed that the remainder of the evaluation will be completed by another provider, this initial triage assessment does not replace that evaluation, and the importance of remaining in the ED until their evaluation is complete.     Linwood Dibbles, MD 03/17/22 [phone]

## 2022-03-16 NOTE — ED Triage Notes (Signed)
Patient reports pain across upper abdomen this afternoon with emesis , no fever or diarrhea .

## 2022-03-17 ENCOUNTER — Emergency Department (HOSPITAL_COMMUNITY): Payer: Medicaid Other

## 2022-03-17 LAB — CBC WITH DIFFERENTIAL/PLATELET
Abs Immature Granulocytes: 0.03 10*3/uL (ref 0.00–0.07)
Basophils Absolute: 0 10*3/uL (ref 0.0–0.1)
Basophils Relative: 0 %
Eosinophils Absolute: 0 10*3/uL (ref 0.0–0.5)
Eosinophils Relative: 0 %
HCT: 37.2 % (ref 36.0–46.0)
Hemoglobin: 12.3 g/dL (ref 12.0–15.0)
Immature Granulocytes: 0 %
Lymphocytes Relative: 24 %
Lymphs Abs: 2.4 10*3/uL (ref 0.7–4.0)
MCH: 28.3 pg (ref 26.0–34.0)
MCHC: 33.1 g/dL (ref 30.0–36.0)
MCV: 85.5 fL (ref 80.0–100.0)
Monocytes Absolute: 1 10*3/uL (ref 0.1–1.0)
Monocytes Relative: 9 %
Neutro Abs: 6.7 10*3/uL (ref 1.7–7.7)
Neutrophils Relative %: 67 %
Platelets: 309 10*3/uL (ref 150–400)
RBC: 4.35 MIL/uL (ref 3.87–5.11)
RDW: 12.6 % (ref 11.5–15.5)
WBC: 10.1 10*3/uL (ref 4.0–10.5)
nRBC: 0 % (ref 0.0–0.2)

## 2022-03-17 MED ORDER — SODIUM CHLORIDE 0.9 % IV BOLUS
1000.0000 mL | Freq: Once | INTRAVENOUS | Status: AC
  Administered 2022-03-17: 1000 mL via INTRAVENOUS

## 2022-03-17 MED ORDER — MORPHINE SULFATE (PF) 4 MG/ML IV SOLN
4.0000 mg | Freq: Once | INTRAVENOUS | Status: AC
  Administered 2022-03-17: 4 mg via INTRAVENOUS
  Filled 2022-03-17: qty 1

## 2022-03-17 MED ORDER — CIPROFLOXACIN HCL 500 MG PO TABS: 500.0000 mg | ORAL_TABLET | Freq: Two times a day (BID) | ORAL | 0 refills | Status: AC

## 2022-03-17 MED ORDER — ONDANSETRON 4 MG PO TBDP: 4.0000 mg | ORAL_TABLET | Freq: Three times a day (TID) | ORAL | 0 refills | Status: DC | PRN

## 2022-03-17 MED ORDER — ONDANSETRON 4 MG PO TBDP
4.0000 mg | ORAL_TABLET | Freq: Once | ORAL | Status: AC
  Administered 2022-03-17: 4 mg via ORAL
  Filled 2022-03-17: qty 1

## 2022-03-17 MED ORDER — HYDROCODONE-ACETAMINOPHEN 5-325 MG PO TABS
2.0000 | ORAL_TABLET | Freq: Once | ORAL | Status: AC
  Administered 2022-03-17: 2 via ORAL
  Filled 2022-03-17: qty 2

## 2022-03-17 MED ORDER — IOHEXOL 350 MG/ML SOLN
75.0000 mL | Freq: Once | INTRAVENOUS | Status: AC | PRN
  Administered 2022-03-17: 75 mL via INTRAVENOUS

## 2022-03-17 MED ORDER — ONDANSETRON HCL 4 MG/2ML IJ SOLN
4.0000 mg | Freq: Once | INTRAMUSCULAR | Status: AC
  Administered 2022-03-17: 4 mg via INTRAVENOUS
  Filled 2022-03-17: qty 2

## 2022-03-17 MED ORDER — METRONIDAZOLE 500 MG PO TABS: 500.0000 mg | ORAL_TABLET | Freq: Three times a day (TID) | ORAL | 0 refills | Status: AC

## 2022-03-17 MED ORDER — METRONIDAZOLE 500 MG PO TABS: 500.0000 mg | ORAL_TABLET | Freq: Two times a day (BID) | ORAL | 0 refills | Status: DC
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
## 2022-03-17 NOTE — Discharge Instructions (Addendum)
You have been seen today for your complaint of abdominal pain. Your lab work initially showed some elevated white blood cells, but came down while you were here. Your imaging showed some inflammation of your colon without specific findings of appendicitis. Your discharge medications include ciprofloxacin and metronidazole. This is an antibiotic. You should take it as prescribed. You should take it for the entire duration of the prescription. This may cause an upset stomach. This is normal. You may take this with food. You may also eat yogurt to prevent diarrhea.  .  You cannot drink alcohol while taking metronidazole.  Will cause you to vomit instantly. I am also sending a prescription for Zofran.  This is a nausea medication.  You may take it as needed in order to eat and drink normal diet. Follow up with: Your primary care provider in 7 days for reassessment Please seek immediate medical care if you develop any of the following symptoms: Your pain does not go away as soon as your health care provider told you to expect. You cannot stop vomiting. Your pain is only in areas of the abdomen, such as the right side or the left lower portion of the abdomen. Pain on the right side could be caused by appendicitis. You have bloody or black stools, or stools that look like tar. You have severe pain, cramping, or bloating in your abdomen. You have signs of dehydration, such as: Dark urine, very little urine, or no urine. Cracked lips. Dry mouth. Sunken eyes. Sleepiness. Weakness. You have trouble breathing or chest pain. At this time there does not appear to be the presence of an emergent medical condition, however there is always the potential for conditions to change. Please read and follow the below instructions.  Do not take your medicine if  develop an itchy rash, swelling in your mouth or lips, or difficulty breathing; call 911 and seek immediate emergency medical attention if this occurs.  You  may review your lab tests and imaging results in their entirety on your MyChart account.  Please discuss all results of fully with your primary care provider and other specialist at your follow-up visit.  Note: Portions of this text may have been transcribed using voice recognition software. Every effort was made to ensure accuracy; however, inadvertent computerized transcription errors may still be present.

## 2022-03-17 NOTE — ED Notes (Signed)
DC instructions reviewed with pt. PT verbalized understanding. PT DC °

## 2022-03-17 NOTE — ED Provider Notes (Signed)
Reassessment:   Still pain and NV.  Drinks moonshine occasionally but no heavy drinking.   Mild lipase elevation, WBC elevated maybe likely from vomiting.   Pt does take 800mg  of ibuprofen nearly every day ?gastritis   , Gailen Shelter 03/17/22 0149    03/19/22, MD 03/17/22 03/19/22

## 2022-03-17 NOTE — ED Provider Notes (Signed)
[name] EMERGENCY DEPARTMENT Provider Note   CSN: [id number] Arrival date & time: 03/16/22  1833     History  Chief Complaint  Patient presents with   Abdominal Pain    Sheryl Tucker is a 26 y.o. female.  With a history of hypothyroidism, depression, previous kidney stones who presents to the ED for evaluation of upper abdominal pain.  Unfortunately patient has had a long wait in the waiting room and has been here for 14 hours on my initial evaluation.  She states that she developed upper abdominal pain yesterday at approximately 1:30 PM.  States it was intermittent.  It would last for 5 to 10 minutes at a time and occur approximately twice per hour.  Her last episode of pain was approximately 5 hours prior to my evaluation.  She described the pain as a twisting sensation.  It would begin in the right upper quadrant and radiate to the epigastric and left upper quadrants.  Has never had symptoms like this in the past.  No abdominal surgical history.  Did have associated vomiting.  Has had 3 episodes of bilious emesis.  Drinks occasionally, vapes daily.  Uses cannabis almost every day.  Last use was 2 days ago.  She states that she is beginning to feel some mild pain in the right lower quadrant now that she has been here for 14 hours.  Denies fevers, chills, dysuria.  Last bowel movement was yesterday afternoon.   Abdominal Pain Associated symptoms: vomiting        Home Medications Prior to Admission medications   Not on File      Allergies    Oxycodone-acetaminophen    Review of Systems   Review of Systems  Gastrointestinal:  Positive for abdominal pain and vomiting.  All other systems reviewed and are negative.   Physical Exam Updated Vital Signs BP 99/74   Pulse 60   Temp 97.9 F (36.6 C) (Oral)   Resp 16   LMP 01/31/2022   SpO2 100%  Physical Exam Vitals and nursing note reviewed.  Constitutional:      General: She is not in acute  distress.    Appearance: She is well-developed. She is obese. She is not ill-appearing, toxic-appearing or diaphoretic.  HENT:     Head: Normocephalic and atraumatic.  Eyes:     Conjunctiva/sclera: Conjunctivae normal.  Cardiovascular:     Rate and Rhythm: Normal rate and regular rhythm.     Heart sounds: No murmur heard. Pulmonary:     Effort: Pulmonary effort is normal. No respiratory distress.     Breath sounds: Normal breath sounds.  Abdominal:     General: Abdomen is flat.     Palpations: Abdomen is soft.     Tenderness: There is generalized abdominal tenderness. There is guarding. Positive signs include McBurney's sign and obturator sign. Negative signs include psoas sign.  Musculoskeletal:        General: No swelling.     Cervical back: Neck supple.  Skin:    General: Skin is warm and dry.     Capillary Refill: Capillary refill takes less than 2 seconds.  Neurological:     General: No focal deficit present.     Mental Status: She is alert and oriented to person, place, and time.  Psychiatric:        Mood and Affect: Mood normal.     ED Results / Procedures / Treatments   Labs (all labs ordered are listed, but  only abnormal results are displayed) Labs Reviewed  LIPASE, BLOOD - Abnormal; Notable for the following components:      Result Value   Lipase 52 (*)    All other components within normal limits  COMPREHENSIVE METABOLIC PANEL - Abnormal; Notable for the following components:   BUN 5 (*)    All other components within normal limits  CBC - Abnormal; Notable for the following components:   WBC 17.2 (*)    All other components within normal limits  URINALYSIS, ROUTINE W REFLEX MICROSCOPIC - Abnormal; Notable for the following components:   Color, Urine AMBER (*)    APPearance CLOUDY (*)    Ketones, ur 5 (*)    Protein, ur 30 (*)    All other components within normal limits  CBC WITH DIFFERENTIAL/PLATELET  I-STAT BETA HCG BLOOD, ED ([facility], [facility], [facility] ONLY)     EKG None  Radiology CT ABDOMEN PELVIS W CONTRAST  Result Date: 03/17/2022 CLINICAL DATA:  26 year old female with postprandial severe upper abdominal pain at 1330 hours. EXAM: CT ABDOMEN AND PELVIS WITH CONTRAST TECHNIQUE: Multidetector CT imaging of the abdomen and pelvis was performed using the standard protocol following bolus administration of intravenous contrast. RADIATION DOSE REDUCTION: This exam was performed according to the departmental dose-optimization program which includes automated exposure control, adjustment of the mA and/or kV according to patient size and/or use of iterative reconstruction technique. CONTRAST:  64mL OMNIPAQUE IOHEXOL 350 MG/ML SOLN COMPARISON:  CT Abdomen and Pelvis 02/25/2020 and earlier. FINDINGS: Lower chest: Negative. Hepatobiliary: Negative liver and gallbladder. Pancreas: Negative. Spleen: Negative. Adrenals/Urinary Tract: Normal adrenal glands. Both kidneys appear nonobstructed today. Right side nephrolithiasis ranging from punctate in the upper pole to 3 mm in the lower pole. Tiny chronic right midpole cyst appears benign (no follow-up imaging recommended). No pararenal inflammation. Decompressed ureters and urinary bladder. Stomach/Bowel: Unremarkable large bowel from the ascending colon to the rectum. Mostly decompressed rectosigmoid colon. Along the anterior and inferior cecum there is confluent fluid or mesenteric edema (coronal image 40) although no cecal wall thickening. On that same image the appendix is well demonstrated tracking medial from the cecum. Appendix size is now at the upper limits of normal (versus decompressed in 2021) and there is suspicion of mild appendix wall thickening and enhancement. Difficult to exclude trace Peri appendiceal inflammation, although the confluent mesenteric finding is located opposite along the cecum. No appendicolith identified. The terminal ileum appears decompressed and negative. Other regional small bowel loops  are decompressed. A tiny dystrophic calcification there in the distal small bowel mesentery is unchanged. No free air.  No other free fluid identified in the abdomen. Stomach and duodenum appear negative.  No dilated small bowel. Vascular/Lymphatic: Major arterial structures in the abdomen and pelvis appear patent and normal. Portal venous system appears patent. No lymphadenopathy identified. Reproductive: Within normal limits. Other: Small volume pelvic free fluid with simple fluid density is most likely physiologic. Musculoskeletal: No acute osseous abnormality identified. Mild disc and endplate degeneration at the lumbosacral junction. IMPRESSION: 1. Difficult to exclude early Appendicitis. The appendix demonstrates borderline to mild enlargement and hyper-enhancement. However, mesenteric edema and/or free fluid in the right lower quadrant is primarily along the opposite side of the cecum. No perforation or abscess. Recommend clinical observation, and if the course is equivocal a repeat CT Abdomen and Pelvis in 12-24 hours may be diagnostic. 2. Additional small volume of free fluid in the pelvis might be secondary to #1, or could be physiologic. 3. No other  acute or inflammatory process identified in the abdomen or pelvis. Right nephrolithiasis, but no obstructive uropathy. Electronically Signed   By: Odessa FlemingH  Hall M.D.   On: 03/17/2022 04:06   US Abdomen Complete  Result Date: 03/16/2022 CLINICAL DATA:  Upper abdominal pain, vomiting EXAM: ABDOMEN ULTRASOUND COMPLETE COMPARISON:  CT abdomen/pelvis dated 02/25/2020 FINDINGS: Gallbladder: No gallstones or wall thickening visualized. No sonographic Murphy sign noted by sonographer. Common bile duct: Diameter: 2 mm Liver: No focal lesion identified. Within normal limits in parenchymal echogenicity. Portal vein is patent on color Doppler imaging with normal direction of blood flow towards the liver. IVC: No abnormality visualized. Pancreas: Visualized portion  unremarkable. Spleen: Size and appearance within normal limits. Right Kidney: Length: 10.7 cm. Echogenicity within normal limits. 7 mm upper pole calculus. 6 mm probable lower pole calculus. No hydronephrosis. Left Kidney: Length: 11.7 cm. Echogenicity within normal limits. 8 mm lower pole calculus. No hydronephrosis. Abdominal aorta: No aneurysm visualized. Other findings: None. IMPRESSION: Bilateral nephrolithiasis. No hydronephrosis. Electronically Signed   By: Charline BillsSriyesh  Krishnan M.D.   On: 03/16/2022 22:37    Procedures Procedures    Medications Ordered in ED Medications  sodium chloride 0.9 % bolus 1,000 mL (has no administration in time range)  ondansetron (ZOFRAN-ODT) disintegrating tablet 4 mg (4 mg Oral Given 03/16/22 1950)  acetaminophen (TYLENOL) tablet 650 mg (650 mg Oral Given 03/16/22 1950)  HYDROcodone-acetaminophen (NORCO/VICODIN) 5-325 MG per tablet 2 tablet (2 tablets Oral Given 03/17/22 0112)  ondansetron (ZOFRAN-ODT) disintegrating tablet 4 mg (4 mg Oral Given 03/17/22 0112)  iohexol (OMNIPAQUE) 350 MG/ML injection 75 mL (75 mLs Intravenous Contrast Given 03/17/22 0332)    ED Course/ Medical Decision Making/ A&P Clinical Course as of 03/17/22 1121  Sat Mar 17, 2022  1021 Spoke with general surgery Dr. Derrell Lollingamirez who will evaluate the patient. [AS]    Clinical Course User Index [AS] Lula OlszewskiSchutt, Edsel PetrinAlexander M, PA-C                           Medical Decision Making Amount and/or Complexity of Data Reviewed Labs: ordered.  Risk Prescription drug management.  This patient presents to the ED for concern of abdominal pain, this involves an extensive number of treatment options, and is a complaint that carries with it a high risk of complications and morbidity.  The differential diagnosis for generalized abdominal pain includes, but is not limited to AAA, gastroenteritis, appendicitis, Bowel obstruction, Bowel perforation. Gastroparesis, DKA, Hernia, Inflammatory bowel disease,  mesenteric ischemia, pancreatitis, peritonitis SBP, volvulus.    Co morbidities that complicate the patient evaluation  hypothyroidism, depression, previous kidney stones  My initial workup includes abdominal pain workup, IV fluids, antiemetics, pain control  Additional history obtained from: Nursing notes from this visit.  I ordered, reviewed and interpreted labs which include: CMP, CBC, lipase, hCG, urinalysis.  Initial leukocytosis of 17.2.  Repeat of 10.1.  Slightly elevated lipase of 52.   I ordered imaging studies including ultrasound abdomen, CT abdomen pelvis I independently visualized and interpreted imaging which showed inflammation near the appendix, concerning for possible early appendicitis. I agree with the radiologist interpretation  Consultations Obtained:  I requested consultation with the general surgery Dr. Derrell Lollingamirez,  and discussed lab and imaging findings as well as pertinent plan - they recommend: Likely not appendicitis at this time.  Send patient home on Cipro and Flagyl.  Strict return precautions.  Afebrile, hemodynamically stable.  Female presenting to the ED for evaluation of  abdominal pain.  Initial workup showed leukocytosis of 17.2 and a CT abdomen concerning for early appendicitis.  Repeat CBC showed normal leukocytosis.  Her pain has been mostly absent for the past 5 hours.  Patient was seen and evaluated by general surgery.  They recommend Cipro and Flagyl and follow-up with PCP for reevaluation of typhlitis.  Current recommendations for uncomplicated appendicitis is 10 to 14 days of Cipro twice daily and Flagyl 3 times daily.  Was also sent prescription for Zofran.  Stress strict return precautions.  Patient is resting comfortably in bed on my evaluation.  She is stable for discharge.  At this time there does not appear to be any evidence of an acute emergency medical condition and the patient appears stable for discharge with appropriate outpatient follow  up. Diagnosis was discussed with patient who verbalizes understanding of care plan and is agreeable to discharge. I have discussed return precautions with patient who verbalizes understanding. Patient encouraged to follow-up with their PCP within 1 week. All questions answered.  Patient's case discussed with Dr. Dalene Seltzer who agrees with plan to discharge with follow-up.   Note: Portions of this report may have been transcribed using voice recognition software. Every effort was made to ensure accuracy; however, inadvertent computerized transcription errors may still be present.          Final Clinical Impression(s) / ED Diagnoses Final diagnoses:  None    Rx / DC Orders ED Discharge Orders     None         Michelle Piper, Cordelia Poche 03/17/22 1230    Alvira Monday, MD 03/19/22 1521

## 2022-05-03 NOTE — Progress Notes (Signed)
 FOOT & ANKLE NEW PATIENT OFFICE VISIT  PATIENT: Sheryl Tucker MRN:  [id number] DOB:  27-Jan-1996 DATE OF SERVICE:  05/03/2022  Primary Physician:  Joesph VEAR Cedar, PA-C  Chief Complaint:  Chief Complaint  Patient presents with  . Left Ankle - Pain    Sheryl Tucker is a 27 y.o. female with a complaint of Left ankle pain. Pt has been noticing decreased range of motion and increase pain with weight-bearing.  Onset:  since 2016 where she reportedly had multiple ankle sprains prior to the onset of chronic pain left foot and ankle causing her to be initially evaluated by [facility] in 2021.  Since establishing care with them she has undergone left ankle arthroscopy with microfracture of the distal tibia in November 2021 and then repeat ankle arthroscopy and microfracture of the talus in August 2022 both performed by Dr. Audria.  In review of the record she was noncompliant with weightbearing restrictions following both of those surgeries.  She then underwent a distal tibia and talar subchondroplasty performed by Dr. Lossie partner Dr. Elihue on October 13, 2021.  Since that time patient's pain has persisted and she has returned to Dr. Lorrin who recommended referral to orthopedics to see if she be appropriate candidate for ankle replacement or not due to persistent pain.  Pain is worse with activity and is relieved with rest. Pain quality: aching, sharp, and shooting along the inside of her ankle as well as outside of the ankle at times. Strength or sensation changes: none. Numbness or tingling: none. The patient has not noticed a change in their foot shape. Medications used for this pain includes OTC.  Previous treatments have included rest, elevation, activity modification, bracing, intra-articular Toradol  injections, intra-articular steroid injections, multiple MRIs, prior surgeries, physical therapy.  Patient's current activity level consists of tries to stay active and  work. Pt can still perform activities of daily living without difficulty.  She is ambulatory to clinic today without use of assistive devices.   No notes on file Past Medical History:  Diagnosis Date  . Anxiety   . Depression   . Disease of thyroid  gland   . History of pneumonia   . Kidney stone   . Numbness    left grt toe  . PONV (postoperative nausea and vomiting) 10/2020   NAUSEA POST KNEE SURGERY.  SABRA PTSD (post-traumatic stress disorder)    Past Surgical History:  Procedure Laterality Date  . Ankle arthroscopy Left 11/17/2020   LEFT ANKLE ARTHROSCOPY with microfracture of osteochondral dome lesion  . Cystoscopy     ren cal  . Wisdom tooth extraction     Allergies  Allergen Reactions  . Oxycodone -Acetaminophen  Anxiety and Unknown    Family History  Problem Relation Age of Onset  . Kidney disease Mother   . Heart disease Father   . No Known Problems Sister   . No Known Problems Sister   . No Known Problems Sister    Social History   Social History Narrative  . Not on file   Social History   Socioeconomic History  . Marital status: Single  Tobacco Use  . Smoking status: Never  . Smokeless tobacco: Never  Vaping Use  . Vaping Use: Every day  Substance and Sexual Activity  . Alcohol use: Yes    Comment: rare  . Drug use: Not Currently    Types: Marijuana    Comment: 3 months  . Sexual activity: Not Currently     Review  of Systems: See below for the review of systems which I have reviewed, edited and corrected.  A complete review of systems was performed and all pertinent points were found to be negative except as above in the HPI.   OBJECTIVE:   VITALS:  Ht 5' 4 (1.626 m)   Wt 220 lb (99.8 kg)   BMI 37.76 kg/m   PHYSICAL EXAMINATION: GENERAL: Well-appearing, alert & oriented, no acute distress.  Appears stated age. HEAD: Normocephalic, atraumatic HEENT: Pupils equal, pupils round  CV: Palpable pulses, extremities warm,  well-perfused RESP: Regular rate, nonlabored breathing NEURO: Sensation intact SKIN: No lesions, No rash PSYCH: Pleasant demeanor, normal affect LYMPH: No lymphadenopathy  Foot/Ankle Exam - Left: Hindfoot alignment: neutral  Skin lesions: Well-healed surgical incisions from prior ankle arthroscopy  (Include callous/nails) Discoloration: None Capillary refill: Intact all toes Wounds are benign, there is no sign of infection Effusion: none   ROM:  Range of motion ankle:   Dorsiflexion = 12-15  Plantarflexion = 35-40   Normal ROM subtalar, [place]op[place], midfoot and forefoot joints  No pain with range of motion  [place] test: positive Talar dome tenderness: positive Anteriorly, Medially , and Laterally  Stability: No increased laxity of ATFL and CFL with stress testing as compared to the contralateral side  Strength:  Muscle Strength: Tib Ant, EHL, FHL, Gastroc/soleus, Peroneals, Posterior tib=5/5  Sensation intact to light touch L3-S1  positive Heel raise test    Achilles reflexes - Exam; reflexes: 2+    Evaluation of Right lower extremity shows normal range of motion, normal stability, normal strength, and no deformities.  Patient's bilateral upper extremities show normal range of motion, normal stability, normal strength, and no deformities.   IMAGING: Weight bearing x-rays (AP/lat/oblique) of the Left foot and ankle were independently reviewed, along with the radiologist report, and demonstrate: Status post subchondroplasty in the distal tibia without any evidence of collapse.  Intact ankle mortise.  Minimal evidence of soft tissue swelling.  No evidence of obvious degenerative changes patient with intact bone tunnel in the distal tibia and talus unchanged from prior  MRI imaging of the left ankle from 04/03/2022 was independently reviewed, along with the radiologist report, and demonstrate:  1.  Continued edema about osteochondral abnormality of the posterior medial tibial  plafond and medial talar dome without evidence of loose body or free fragment. 2.  Previous subchondroplasty of the distal tibia and talar dome. 3.  Moderate tibiotalar, subtalar and talonavicular joint effusions with proliferative tissue and capsular irregularity possibly postsurgical in nature of the anterior tibiotalar joint. 4.  Mild chronic plantar fasciitis with possible early retrocalcaneal bursitis. 5.  Acute versus chronic sprain or previous partial tear of the lateral and deep deltoid ligaments of the ankle. 6.  Previous subtalar injury or instability with probable previous at least partial tears of the subtalar ligaments and fluid/collection of tissue extending into the tarsal sinus with unchanged erosions or intraosseous ganglia about the tarsal sinus.  Reviewed Data: Clinical notes, clinical labs results, and/or other studies reviewed from the MEDICAL RECORD NUMBER  Lab Results  Component Value Date/Time   WBC 8.9 03/02/2020 11:45 AM   Hemoglobin 15.8 03/02/2020 11:45 AM   Creatinine 0.60 02/23/2020 02:51 PM      ASSESSMENT:   1. Acute left ankle pain     PLAN:  -At this point in time patient continues to have chronic pain of the left ankle is interested in pursuing additional surgery however is not interested in pursuing ankle fusion.  She is interested in potentially pursuing total ankle replacement.  I instructed the patient that I would not feel comfortable performing this procedure on someone her age whereby if she wish is to discuss or pursue that surgery then I recommend further evaluation at a tertiary care center such as [facility] or  [facility] in [place].  She expresses the desire to continue to pursue total ankle arthroplasty as such referral was placed today to Dr. Roy at [facility] for further evaluation and discussion of ongoing management options for the patient in regards to her left ankle.  -  Weightbearing as tolerated  in your brace for comfort  -  start to work on ankle/toe range of motion daily  - continue to ice and elevate the extremity frequently; continue warm water/epsom salts soaks as well  - Do not need to sleep in your brace  - Continue with over the counters for pain control   - can massage area 4 times a day with topical voltaren gel or similar NSAID gel (icy hot/aspercreme, etc) available over the counter  - ok to shower, ok to soak or submerge  - Followup: as needed   Risks, benefits, and alternatives of the medications and treatment plan prescribed today were discussed.  All questions were answered and the patient expressed understanding and agreement with the treatment plan.     This note was transcribed with voice recognition software.  Please excuse transcription errors.  Author:  Glean CHRISTELLA Dimes, MD 05/03/2022 1:40 PM  CC: Joesph VEAR Cedar, PA-C

## 2022-07-19 ENCOUNTER — Encounter (HOSPITAL_COMMUNITY): Payer: Self-pay

## 2022-07-19 ENCOUNTER — Emergency Department (HOSPITAL_COMMUNITY)
Admission: EM | Admit: 2022-07-19 | Discharge: 2022-07-20 | Disposition: A | Discharge status: Home / Self Care | Payer: Self-pay | Attending: Emergency Medicine | Admitting: Emergency Medicine

## 2022-07-19 ENCOUNTER — Other Ambulatory Visit: Payer: Self-pay

## 2022-07-19 DIAGNOSIS — R112 Nausea with vomiting, unspecified: Secondary | ICD-10-CM | POA: Insufficient documentation

## 2022-07-19 DIAGNOSIS — R1110 Vomiting, unspecified: Secondary | ICD-10-CM

## 2022-07-19 DIAGNOSIS — R1084 Generalized abdominal pain: Secondary | ICD-10-CM | POA: Insufficient documentation

## 2022-07-19 LAB — CBC WITH DIFFERENTIAL/PLATELET
Abs Immature Granulocytes: 0.03 10*3/uL (ref 0.00–0.07)
Basophils Absolute: 0 10*3/uL (ref 0.0–0.1)
Basophils Relative: 0 %
Eosinophils Absolute: 0 10*3/uL (ref 0.0–0.5)
Eosinophils Relative: 0 %
HCT: 39.4 % (ref 36.0–46.0)
Hemoglobin: 13 g/dL (ref 12.0–15.0)
Immature Granulocytes: 0 %
Lymphocytes Relative: 31 %
Lymphs Abs: 2.6 10*3/uL (ref 0.7–4.0)
MCH: 27.4 pg (ref 26.0–34.0)
MCHC: 33 g/dL (ref 30.0–36.0)
MCV: 83.1 fL (ref 80.0–100.0)
Monocytes Absolute: 0.6 10*3/uL (ref 0.1–1.0)
Monocytes Relative: 7 %
Neutro Abs: 5.1 10*3/uL (ref 1.7–7.7)
Neutrophils Relative %: 62 %
Platelets: 304 10*3/uL (ref 150–400)
RBC: 4.74 MIL/uL (ref 3.87–5.11)
RDW: 12.9 % (ref 11.5–15.5)
WBC: 8.4 10*3/uL (ref 4.0–10.5)
nRBC: 0 % (ref 0.0–0.2)

## 2022-07-19 LAB — PREGNANCY, URINE: Preg Test, Ur: NEGATIVE

## 2022-07-19 MED ORDER — SODIUM CHLORIDE 0.9 % IV BOLUS
1000.0000 mL | Freq: Once | INTRAVENOUS | Status: AC
  Administered 2022-07-19: 1000 mL via INTRAVENOUS

## 2022-07-19 MED ORDER — DIPHENHYDRAMINE HCL 50 MG/ML IJ SOLN
12.5000 mg | Freq: Once | INTRAMUSCULAR | Status: AC
  Administered 2022-07-19: 12.5 mg via INTRAVENOUS
  Filled 2022-07-19: qty 1

## 2022-07-19 MED ORDER — ONDANSETRON 4 MG PO TBDP
4.0000 mg | ORAL_TABLET | Freq: Once | ORAL | Status: AC
  Administered 2022-07-19: 4 mg via ORAL
  Filled 2022-07-19: qty 1

## 2022-07-19 MED ORDER — METOCLOPRAMIDE HCL 5 MG/ML IJ SOLN
10.0000 mg | Freq: Once | INTRAMUSCULAR | Status: AC
  Administered 2022-07-19: 10 mg via INTRAVENOUS
  Filled 2022-07-19: qty 2

## 2022-07-19 NOTE — ED Triage Notes (Signed)
Patient reports for the past 4-5 days she has been having n/v, diarrhea at first and now constipation. States she also has had some abd pain/cramping on bil lower quadrants.

## 2022-07-19 NOTE — ED Provider Notes (Signed)
[place] EMERGENCY DEPARTMENT AT [facility] Provider Note   CSN: [id number] Arrival date & time: 07/19/22  2118     History  Chief Complaint  Patient presents with   Nausea   Abdominal Cramping    Sheryl Tucker is a 27 y.o. female.  Patient complains of abdominal pain and vomiting.  She reports that she has had multiple episodes of vomiting.  Patient has a history of episodes of vomiting.  Patient reports she has had nausea vomiting and diarrhea for the past 4 days.  Patient reports today she feels like she is constipated.  Complains of persistent vomiting.  Patient has a history of kidney stones.  The history is provided by the patient. No language interpreter was used.  Abdominal Cramping This is a new problem. The current episode started more than 2 days ago. The problem occurs constantly. Nothing aggravates the symptoms. Nothing relieves the symptoms. She has tried nothing for the symptoms.       Home Medications Prior to Admission medications   Medication Sig Start Date End Date Taking? Authorizing Provider  ondansetron (ZOFRAN-ODT) 4 MG disintegrating tablet Take 1 tablet (4 mg total) by mouth every 8 (eight) hours as needed for nausea or vomiting. 03/17/22   Schutt, Edsel Petrin, PA-C      Allergies    Oxycodone-acetaminophen    Review of Systems   Review of Systems  Gastrointestinal:  Positive for abdominal distention, constipation, diarrhea, nausea and vomiting.  All other systems reviewed and are negative.   Physical Exam Updated Vital Signs BP 115/76 (BP Location: Left Arm)   Pulse 79   Temp 98.3 F (36.8 C) (Oral)   Resp 17   Ht  (1.626 m)   Wt 102.5 kg   SpO2 97%   BMI 38.79 kg/m  Physical Exam Vitals and nursing note reviewed.  Constitutional:      Appearance: She is well-developed.     Comments: Patient vomiting during my exam  HENT:     Head: Normocephalic.     Mouth/Throat:     Mouth: Mucous membranes are moist.   Eyes:     Pupils: Pupils are equal, round, and reactive to light.  Cardiovascular:     Rate and Rhythm: Normal rate.  Pulmonary:     Effort: Pulmonary effort is normal.  Abdominal:     General: Abdomen is flat. There is no distension.     Tenderness: There is abdominal tenderness.     Comments: Tender bilateral quadrants of abdomen.  Musculoskeletal:        General: Normal range of motion.     Cervical back: Normal range of motion.  Skin:    General: Skin is warm.  Neurological:     General: No focal deficit present.     Mental Status: She is alert and oriented to person, place, and time.  Psychiatric:        Mood and Affect: Mood normal.     ED Results / Procedures / Treatments   Labs (all labs ordered are listed, but only abnormal results are displayed) Labs Reviewed  CBC WITH DIFFERENTIAL/PLATELET  COMPREHENSIVE METABOLIC PANEL  URINALYSIS, ROUTINE W REFLEX MICROSCOPIC  PREGNANCY, URINE  LIPASE, BLOOD  CBC WITH DIFFERENTIAL/PLATELET    EKG None  Radiology No results found.  Procedures Procedures    Medications Ordered in ED Medications  ondansetron (ZOFRAN-ODT) disintegrating tablet 4 mg (4 mg Oral Given 07/19/22 2235)  sodium chloride 0.9 % bolus 1,000 mL (  1,000 mLs Intravenous New Bag/Given 07/19/22 2333)    ED Course/ Medical Decision Making/ A&P                             Medical Decision Making Patient complains of lower abdominal pain nausea vomiting and diarrhea.  Amount and/or Complexity of Data Reviewed External Data Reviewed: notes.    Details: Review his ED notes reviewed Labs: ordered. Decision-making details documented in ED Course.    Details: Ordered reviewed and interpreted Radiology: ordered.    Details: CT scan abdomen with contrast ordered  Risk Prescription drug management.   Since care turned over to Sharilyn Sites, PA-C being labs and CT scan        Final Clinical Impression(s) / ED Diagnoses Final diagnoses:   Vomiting, unspecified vomiting type, unspecified whether nausea present  Generalized abdominal pain    Rx / DC Orders ED Discharge Orders     None         Osie Cheeks 07/19/22 2344    Glyn Ade, MD 07/20/22 1458

## 2022-07-20 ENCOUNTER — Emergency Department (HOSPITAL_COMMUNITY): Payer: Self-pay

## 2022-07-20 ENCOUNTER — Encounter (HOSPITAL_COMMUNITY): Payer: Self-pay

## 2022-07-20 LAB — COMPREHENSIVE METABOLIC PANEL
ALT: 17 U/L (ref 0–44)
AST: 18 U/L (ref 15–41)
Albumin: 3.5 g/dL (ref 3.5–5.0)
Alkaline Phosphatase: 62 U/L (ref 38–126)
Anion gap: 7 (ref 5–15)
BUN: 8 mg/dL (ref 6–20)
CO2: 23 mmol/L (ref 22–32)
Calcium: 8 mg/dL — ABNORMAL LOW (ref 8.9–10.3)
Chloride: 105 mmol/L (ref 98–111)
Creatinine, Ser: 0.71 mg/dL (ref 0.44–1.00)
GFR, Estimated: >60 mL/min (ref >60)
Glucose, Bld: 85 mg/dL (ref 70–99)
Potassium: 3.6 mmol/L (ref 3.5–5.1)
Sodium: 135 mmol/L (ref 135–145)
Total Bilirubin: 0.7 mg/dL (ref 0.3–1.2)
Total Protein: 6.7 g/dL (ref 6.5–8.1)

## 2022-07-20 LAB — URINALYSIS, ROUTINE W REFLEX MICROSCOPIC
Bilirubin Urine: NEGATIVE
Glucose, UA: NEGATIVE mg/dL
Hgb urine dipstick: NEGATIVE
Ketones, ur: NEGATIVE mg/dL
Nitrite: NEGATIVE
Protein, ur: 100 mg/dL — AB
Specific Gravity, Urine: 1.021 (ref 1.005–1.030)
pH: 8 (ref 5.0–8.0)

## 2022-07-20 LAB — LIPASE, BLOOD: Lipase: 27 U/L (ref 11–51)

## 2022-07-20 MED ORDER — SODIUM CHLORIDE (PF) 0.9 % IJ SOLN
INTRAMUSCULAR | Status: AC
  Filled 2022-07-20: qty 50

## 2022-07-20 MED ORDER — ONDANSETRON 4 MG PO TBDP: 4.0000 mg | ORAL_TABLET | Freq: Three times a day (TID) | ORAL | 0 refills | Status: DC | PRN

## 2022-07-20 MED ORDER — DICYCLOMINE HCL 20 MG PO TABS: 20.0000 mg | ORAL_TABLET | Freq: Two times a day (BID) | ORAL | 0 refills | Status: AC

## 2022-07-20 MED ORDER — ONDANSETRON HCL 4 MG/2ML IJ SOLN
4.0000 mg | Freq: Once | INTRAMUSCULAR | Status: AC
  Administered 2022-07-20: 4 mg via INTRAVENOUS
  Filled 2022-07-20: qty 2

## 2022-07-20 MED ORDER — IOHEXOL 300 MG/ML  SOLN
100.0000 mL | Freq: Once | INTRAMUSCULAR | Status: AC | PRN
  Administered 2022-07-20: 100 mL via INTRAVENOUS

## 2022-07-20 NOTE — Discharge Instructions (Signed)
Take the prescribed medication as directed.  Make sure to rest and hydrate. Follow-up with your primary care doctor. Return to the ED for new or worsening symptoms. 

## 2022-07-20 NOTE — ED Provider Notes (Signed)
Results for orders placed or performed during the hospital encounter of 07/19/22  Urinalysis, Routine w reflex microscopic -Urine, Clean Catch  Result Value Ref Range   Color, Urine YELLOW YELLOW   APPearance CLOUDY (A) CLEAR   Specific Gravity, Urine 1.021 1.005 - 1.030   pH 8.0 5.0 - 8.0   Glucose, UA NEGATIVE NEGATIVE mg/dL   Hgb urine dipstick NEGATIVE NEGATIVE   Bilirubin Urine NEGATIVE NEGATIVE   Ketones, ur NEGATIVE NEGATIVE mg/dL   Protein, ur 161 (A) NEGATIVE mg/dL   Nitrite NEGATIVE NEGATIVE   Leukocytes,Ua MODERATE (A) NEGATIVE   RBC / HPF 0-5 0 - 5 RBC/hpf   WBC, UA 11-20 0 - 5 WBC/hpf   Bacteria, UA MANY (A) NONE SEEN   Squamous Epithelial / HPF 21-50 0 - 5 /HPF   Mucus PRESENT    Amorphous Crystal PRESENT   Pregnancy, urine  Result Value Ref Range   Preg Test, Ur NEGATIVE NEGATIVE  CBC with Differential/Platelet  Result Value Ref Range   WBC 8.4 4.0 - 10.5 K/uL   RBC 4.74 3.87 - 5.11 MIL/uL   Hemoglobin 13.0 12.0 - 15.0 g/dL   HCT 09.6 04.5 - 40.9 %   MCV 83.1 80.0 - 100.0 fL   MCH 27.4 26.0 - 34.0 pg   MCHC 33.0 30.0 - 36.0 g/dL   RDW 81.1 91.4 - 78.2 %   Platelets 304 150 - 400 K/uL   nRBC 0.0 0.0 - 0.2 %   Neutrophils Relative % 62 %   Neutro Abs 5.1 1.7 - 7.7 K/uL   Lymphocytes Relative 31 %   Lymphs Abs 2.6 0.7 - 4.0 K/uL   Monocytes Relative 7 %   Monocytes Absolute 0.6 0.1 - 1.0 K/uL   Eosinophils Relative 0 %   Eosinophils Absolute 0.0 0.0 - 0.5 K/uL   Basophils Relative 0 %   Basophils Absolute 0.0 0.0 - 0.1 K/uL   Immature Granulocytes 0 %   Abs Immature Granulocytes 0.03 0.00 - 0.07 K/uL  Comprehensive metabolic panel  Result Value Ref Range   Sodium 135 135 - 145 mmol/L   Potassium 3.6 3.5 - 5.1 mmol/L   Chloride 105 98 - 111 mmol/L   CO2 23 22 - 32 mmol/L   Glucose, Bld 85 70 - 99 mg/dL   BUN 8 6 - 20 mg/dL   Creatinine, Ser 9.56 0.44 - 1.00 mg/dL   Calcium 8.0 (L) 8.9 - 10.3 mg/dL   Total Protein 6.7 6.5 - 8.1 g/dL   Albumin 3.5  3.5 - 5.0 g/dL   AST 18 15 - 41 U/L   ALT 17 0 - 44 U/L   Alkaline Phosphatase 62 38 - 126 U/L   Total Bilirubin 0.7 0.3 - 1.2 mg/dL   GFR, Estimated >21 >30 mL/min   Anion gap 7 5 - 15  Lipase, blood  Result Value Ref Range   Lipase 27 11 - 51 U/L   CT ABDOMEN PELVIS W CONTRAST  Result Date: 07/20/2022 CLINICAL DATA:  Nausea, vomiting, diarrhea and abdominal pain for 5 days. EXAM: CT ABDOMEN AND PELVIS WITH CONTRAST TECHNIQUE: Multidetector CT imaging of the abdomen and pelvis was performed using the standard protocol following bolus administration of intravenous contrast. RADIATION DOSE REDUCTION: This exam was performed according to the departmental dose-optimization program which includes automated exposure control, adjustment of the mA and/or kV according to patient size and/or use of iterative reconstruction technique. CONTRAST:  OMNIPAQUE IOHEXOL 300 MG/ML  SOLN COMPARISON:  CT abdomen and pelvis 03/17/2022 FINDINGS: Lower chest: No acute abnormality. Hepatobiliary: Unremarkable. Pancreas: Unremarkable. Spleen: Unremarkable. Adrenals/Urinary Tract: Normal adrenal glands. Nonobstructing right nephrolithiasis. Right renal cysts. No follow-up recommended. No obstructing urinary calculi or hydronephrosis. Unremarkable bladder. Stomach/Bowel: Normal caliber large and small bowel. Question mild wall thickening about the ascending and proximal transverse colon with trace free fluid in the right pericolic gutter. Possible mild wall thickening about the jejunum versus underdistention. Stomach is within normal limits. Vascular/Lymphatic: No significant vascular findings are present. Shotty mesenteric lymph nodes measuring up to 10 mm in the right lower quadrant, likely reactive. Reproductive: Physiologic appearance of the uterus and ovaries. Other: Trace free fluid in the pelvis. No free intraperitoneal air. No acute osseous abnormality. Musculoskeletal: No acute abnormality. IMPRESSION: 1. Findings  suggestive of mild enterocolitis.  Normal appendix. 2. Nonobstructing right nephrolithiasis. Electronically Signed   By: Minerva Fester M.D.   On: 07/20/2022 03:30    CT with findings of mild enterocolitis.  Likely viral in nature.  Symptoms well controlled at this time.  Stable for discharge with symptomatic care.  Encouraged rest, hydration.  Follow-up with PCP.  Return here for new concerns.   Garlon Hatchet, PA-C 07/20/22 9528    Nicanor Alcon, April, MD 07/20/22 0430

## 2022-08-13 ENCOUNTER — Other Ambulatory Visit: Payer: Self-pay

## 2022-08-13 ENCOUNTER — Emergency Department (HOSPITAL_COMMUNITY)
Admission: EM | Admit: 2022-08-13 | Discharge: 2022-08-13 | Disposition: A | Discharge status: Home / Self Care | Payer: Self-pay | Attending: Emergency Medicine | Admitting: Emergency Medicine

## 2022-08-13 ENCOUNTER — Emergency Department (HOSPITAL_COMMUNITY): Payer: Self-pay

## 2022-08-13 ENCOUNTER — Encounter (HOSPITAL_COMMUNITY): Payer: Self-pay

## 2022-08-13 DIAGNOSIS — N390 Urinary tract infection, site not specified: Secondary | ICD-10-CM | POA: Insufficient documentation

## 2022-08-13 DIAGNOSIS — R109 Unspecified abdominal pain: Secondary | ICD-10-CM

## 2022-08-13 DIAGNOSIS — Z5329 Procedure and treatment not carried out because of patient's decision for other reasons: Secondary | ICD-10-CM | POA: Insufficient documentation

## 2022-08-13 LAB — URINALYSIS, ROUTINE W REFLEX MICROSCOPIC
Bilirubin Urine: NEGATIVE
Glucose, UA: NEGATIVE mg/dL
Ketones, ur: NEGATIVE mg/dL
Nitrite: POSITIVE — AB
Protein, ur: 30 mg/dL — AB
RBC / HPF: >50 RBC/hpf (ref 0–5)
Specific Gravity, Urine: 1.011 (ref 1.005–1.030)
pH: 6 (ref 5.0–8.0)

## 2022-08-13 LAB — CBC
HCT: 40.6 % (ref 36.0–46.0)
Hemoglobin: 13.3 g/dL (ref 12.0–15.0)
MCH: 27.8 pg (ref 26.0–34.0)
MCHC: 32.8 g/dL (ref 30.0–36.0)
MCV: 84.9 fL (ref 80.0–100.0)
Platelets: 347 K/uL (ref 150–400)
RBC: 4.78 MIL/uL (ref 3.87–5.11)
RDW: 12.9 % (ref 11.5–15.5)
WBC: 12.9 K/uL — ABNORMAL HIGH (ref 4.0–10.5)
nRBC: 0 % (ref 0.0–0.2)

## 2022-08-13 LAB — BASIC METABOLIC PANEL
Anion gap: 8 (ref 5–15)
BUN: 8 mg/dL (ref 6–20)
CO2: 25 mmol/L (ref 22–32)
Calcium: 9.8 mg/dL (ref 8.9–10.3)
Chloride: 103 mmol/L (ref 98–111)
Creatinine, Ser: 0.63 mg/dL (ref 0.44–1.00)
GFR, Estimated: >60 mL/min (ref >60)
Glucose, Bld: 104 mg/dL — ABNORMAL HIGH (ref 70–99)
Potassium: 4.2 mmol/L (ref 3.5–5.1)
Sodium: 136 mmol/L (ref 135–145)

## 2022-08-13 LAB — I-STAT BETA HCG BLOOD, ED (MC, WL, AP ONLY): I-stat hCG, quantitative: 8.7 m[IU]/mL — ABNORMAL HIGH (ref <5)

## 2022-08-13 LAB — HCG, SERUM, QUALITATIVE: Preg, Serum: NEGATIVE

## 2022-08-13 MED ORDER — SODIUM CHLORIDE 0.9 % IV SOLN
2.0000 g | Freq: Once | INTRAVENOUS | Status: AC
  Administered 2022-08-13: 2 g via INTRAVENOUS
  Filled 2022-08-13: qty 20

## 2022-08-13 MED ORDER — ONDANSETRON HCL 4 MG/2ML IJ SOLN
4.0000 mg | Freq: Once | INTRAMUSCULAR | Status: AC
  Administered 2022-08-13: 4 mg via INTRAVENOUS
  Filled 2022-08-13: qty 2

## 2022-08-13 MED ORDER — FENTANYL CITRATE PF 50 MCG/ML IJ SOSY
100.0000 ug | PREFILLED_SYRINGE | Freq: Once | INTRAMUSCULAR | Status: AC
  Administered 2022-08-13: 100 ug via INTRAVENOUS
  Filled 2022-08-13: qty 2

## 2022-08-13 MED ORDER — METOCLOPRAMIDE HCL 5 MG/ML IJ SOLN
10.0000 mg | Freq: Once | INTRAMUSCULAR | Status: AC
  Administered 2022-08-13: 10 mg via INTRAVENOUS
  Filled 2022-08-13: qty 2

## 2022-08-13 MED ORDER — LACTATED RINGERS IV BOLUS
1000.0000 mL | Freq: Once | INTRAVENOUS | Status: AC
  Administered 2022-08-13: 1000 mL via INTRAVENOUS

## 2022-08-13 NOTE — ED Triage Notes (Signed)
Reports bilateral flank pain since yesterday and unsure if she is passing stones.  Taking azo. +nausea no vomiting.  Hx kidney stones

## 2022-08-13 NOTE — ED Notes (Signed)
Patient walked up to nurses station and requested IV out, stating she is tired of waiting and wanted to leave. We attempted to get her to stay to talk to provider, but she refused. IV removed, patient left AMA without agreeing to sign.

## 2022-08-13 NOTE — ED Provider Notes (Signed)
[place] EMERGENCY DEPARTMENT AT [facility] Provider Note   CSN: [id number] Arrival date & time: 08/13/22  1522     History {Add pertinent medical, surgical, social history, OB history to HPI:1} Chief Complaint  Patient presents with   Flank Pain    Sheryl Tucker is a 27 y.o. female.   Flank Pain Associated symptoms include abdominal pain.  Patient presents for bilateral flank pain.  Medical history includes nephrolithiasis, depression.  Onset of pain was yesterday.  It began on her left side.  Patient experienced pain in left flank, left lower quadrant, and left lower back.  Today, she developed a dull pain in her right flank as well.  Pain became severe and she went home from work.  She took an Azo at home to help relieve the symptoms.  It did not help at all.  Pain continued to worsen in severity.  Currently, pain is severe and equal bilaterally.  She has had nausea without vomiting.     Home Medications Prior to Admission medications   Medication Sig Start Date End Date Taking? Authorizing Provider  dicyclomine (BENTYL) 20 MG tablet Take 1 tablet (20 mg total) by mouth 2 (two) times daily. 07/20/22   Garlon Hatchet, PA-C  ondansetron (ZOFRAN-ODT) 4 MG disintegrating tablet Take 1 tablet (4 mg total) by mouth every 8 (eight) hours as needed for nausea. 07/20/22   Garlon Hatchet, PA-C      Allergies    Oxycodone-acetaminophen    Review of Systems   Review of Systems  Gastrointestinal:  Positive for abdominal pain and nausea.  Genitourinary:  Positive for flank pain.  Musculoskeletal:  Positive for back pain.  All other systems reviewed and are negative.   Physical Exam Updated Vital Signs BP 116/74 (BP Location: Right Arm)   Pulse 68   Temp 98.3 F (36.8 C)   Resp 17   Ht 5\' 4"  (1.626 m)   Wt 102.5 kg   SpO2 96%   BMI 38.79 kg/m  Physical Exam Vitals and nursing note reviewed.  Constitutional:      General: She is not in acute distress.     Appearance: Normal appearance. She is well-developed. She is not ill-appearing, toxic-appearing or diaphoretic.  HENT:     Head: Normocephalic and atraumatic.     Right Ear: External ear normal.     Left Ear: External ear normal.     Nose: Nose normal.     Mouth/Throat:     Mouth: Mucous membranes are moist.  Eyes:     Extraocular Movements: Extraocular movements intact.     Conjunctiva/sclera: Conjunctivae normal.  Cardiovascular:     Rate and Rhythm: Normal rate and regular rhythm.     Heart sounds: No murmur heard. Pulmonary:     Effort: Pulmonary effort is normal. No respiratory distress.  Abdominal:     General: There is no distension.     Palpations: Abdomen is soft.     Tenderness: There is no abdominal tenderness.  Musculoskeletal:        General: No swelling. Normal range of motion.     Cervical back: Normal range of motion and neck supple.     Right lower leg: No edema.     Left lower leg: No edema.  Skin:    General: Skin is warm and dry.     Coloration: Skin is not jaundiced or pale.  Neurological:     General: No focal deficit present.  Mental Status: She is alert and oriented to person, place, and time.  Psychiatric:        Mood and Affect: Mood normal.        Behavior: Behavior normal.     ED Results / Procedures / Treatments   Labs (all labs ordered are listed, but only abnormal results are displayed) Labs Reviewed  URINALYSIS, ROUTINE W REFLEX MICROSCOPIC - Abnormal; Notable for the following components:      Result Value   Color, Urine AMBER (*)    Hgb urine dipstick SMALL (*)    Protein, ur 30 (*)    Nitrite POSITIVE (*)    Leukocytes,Ua SMALL (*)    Bacteria, UA RARE (*)    All other components within normal limits  CBC - Abnormal; Notable for the following components:   WBC 12.9 (*)    All other components within normal limits  I-STAT BETA HCG BLOOD, ED ([facility], [facility], [facility] ONLY) - Abnormal; Notable for the following components:   I-stat hCG,  quantitative 8.7 (*)    All other components within normal limits  BASIC METABOLIC PANEL    EKG None  Radiology No results found.  Procedures Procedures  {Document cardiac monitor, telemetry assessment procedure when appropriate:1}  Medications Ordered in ED Medications - No data to display  ED Course/ Medical Decision Making/ A&P   {   Click here for ABCD2, HEART and other calculatorsREFRESH Note before signing :1}                          Medical Decision Making Amount and/or Complexity of Data Reviewed Labs: ordered. Radiology: ordered.  Risk Prescription drug management.   This patient presents to the ED for concern of ***, this involves an extensive number of treatment options, and is a complaint that carries with it a high risk of complications and morbidity.  The differential diagnosis includes ***   Co morbidities that complicate the patient evaluation  ***   Additional history obtained:  Additional history obtained from *** External records from outside source obtained and reviewed including ***   Lab Tests:  I Ordered, and personally interpreted labs.  The pertinent results include:  ***   Imaging Studies ordered:  I ordered imaging studies including ***  I independently visualized and interpreted imaging which showed *** I agree with the radiologist interpretation   Cardiac Monitoring: / EKG:  The patient was maintained on a cardiac monitor.  I personally viewed and interpreted the cardiac monitored which showed an underlying rhythm of: ***   Consultations Obtained:  I requested consultation with the ***,  and discussed lab and imaging findings as well as pertinent plan - they recommend: ***   Problem List / ED Course / Critical interventions / Medication management  Patient presents for bilateral flank pain.  She does have history of nephrolithiasis and symptoms are reminiscent of prior episodes of kidney stones.  Onset of pain was  yesterday and it began on the left side.  Today, she developed pain on the right side as well.  Currently, pain is equal bilaterally and severe.  She has had associated nausea without vomiting.  On exam, she is well-appearing.  She appears to have intermittent spasms of pain.  Her abdomen is soft and she does not have any abdominal, flank, or CVA tenderness.  Presentation is concerning for nephrolithiasis.  Stone study was ordered.  IV fluids, fentanyl, and Zofran ordered for symptomatic relief.***. I  ordered medication including ***  for ***  Reevaluation of the patient after these medicines showed that the patient {resolved/improved/worsened:23923::"improved"} I have reviewed the patients home medicines and have made adjustments as needed   Social Determinants of Health:  ***   Test / Admission - Considered:  ***   {Document critical care time when appropriate:1} {Document review of labs and clinical decision tools ie heart score, Chads2Vasc2 etc:1}  {Document your independent review of radiology images, and any outside records:1} {Document your discussion with family members, caretakers, and with consultants:1} {Document social determinants of health affecting pt's care:1} {Document your decision making why or why not admission, treatments were needed:1} Final Clinical Impression(s) / ED Diagnoses Final diagnoses:  None    Rx / DC Orders ED Discharge Orders     None

## 2023-03-14 ENCOUNTER — Ambulatory Visit: Payer: Medicaid Other | Admitting: Physician Assistant

## 2023-03-14 ENCOUNTER — Ambulatory Visit: Payer: Medicaid Other | Admitting: Urology

## 2023-05-31 ENCOUNTER — Emergency Department
Admission: EM | Admit: 2023-05-31 | Discharge: 2023-05-31 | Disposition: A | Discharge status: Home / Self Care | Payer: Self-pay | Attending: Emergency Medicine | Admitting: Emergency Medicine

## 2023-05-31 ENCOUNTER — Other Ambulatory Visit: Payer: Self-pay

## 2023-05-31 DIAGNOSIS — X500XXA Overexertion from strenuous movement or load, initial encounter: Secondary | ICD-10-CM | POA: Insufficient documentation

## 2023-05-31 DIAGNOSIS — E039 Hypothyroidism, unspecified: Secondary | ICD-10-CM | POA: Insufficient documentation

## 2023-05-31 DIAGNOSIS — R202 Paresthesia of skin: Secondary | ICD-10-CM | POA: Insufficient documentation

## 2023-05-31 DIAGNOSIS — S29012A Strain of muscle and tendon of back wall of thorax, initial encounter: Secondary | ICD-10-CM | POA: Insufficient documentation

## 2023-05-31 DIAGNOSIS — M546 Pain in thoracic spine: Secondary | ICD-10-CM

## 2023-05-31 LAB — BASIC METABOLIC PANEL
Anion gap: 11 (ref 5–15)
BUN: 11 mg/dL (ref 6–20)
CO2: 22 mmol/L (ref 22–32)
Calcium: 8.9 mg/dL (ref 8.9–10.3)
Chloride: 103 mmol/L (ref 98–111)
Creatinine, Ser: 0.66 mg/dL (ref 0.44–1.00)
GFR, Estimated: >60 mL/min (ref >60)
Glucose, Bld: 88 mg/dL (ref 70–99)
Potassium: 4 mmol/L (ref 3.5–5.1)
Sodium: 136 mmol/L (ref 135–145)

## 2023-05-31 LAB — CBC
HCT: 39.6 % (ref 36.0–46.0)
Hemoglobin: 13 g/dL (ref 12.0–15.0)
MCH: 26.9 pg (ref 26.0–34.0)
MCHC: 32.8 g/dL (ref 30.0–36.0)
MCV: 82 fL (ref 80.0–100.0)
Platelets: 372 10*3/uL (ref 150–400)
RBC: 4.83 MIL/uL (ref 3.87–5.11)
RDW: 13.2 % (ref 11.5–15.5)
WBC: 10 10*3/uL (ref 4.0–10.5)
nRBC: 0 % (ref 0.0–0.2)

## 2023-05-31 LAB — TSH: TSH: 2.796 u[IU]/mL (ref 0.350–4.500)

## 2023-05-31 LAB — T4, FREE: Free T4: 0.67 ng/dL (ref 0.61–1.12)

## 2023-05-31 MED ORDER — ONDANSETRON 4 MG PO TBDP: 4.0000 mg | ORAL_TABLET | Freq: Three times a day (TID) | ORAL | 0 refills | Status: AC | PRN

## 2023-05-31 MED ORDER — CYCLOBENZAPRINE HCL 5 MG PO TABS: 5.0000 mg | ORAL_TABLET | Freq: Three times a day (TID) | ORAL | 0 refills | Status: AC | PRN

## 2023-05-31 MED ORDER — LIDOCAINE 5 % EX PTCH: 1.0000 | MEDICATED_PATCH | Freq: Two times a day (BID) | CUTANEOUS | 0 refills | Status: AC | PRN

## 2023-05-31 NOTE — Discharge Instructions (Signed)
 Your exam and labs are normal and reassuring at this time. Your symptoms appear to be due to a muscle strain and injury. Take the prescription meds as directed. Apply moist heat as discussed.  Perform simple range of motion exercises as demonstrated.

## 2023-05-31 NOTE — ED Notes (Signed)
 Patient declined discharge vital signs.

## 2023-05-31 NOTE — ED Triage Notes (Signed)
 Pt here with numbness to her right arm x2 hours. Pt also having pain up to her shoulder blade. Pt denies cp or sob. Pt ambulatory to triage.

## 2023-05-31 NOTE — ED Provider Notes (Signed)
 [facility] Emergency Department Provider Note     Event Date/Time   First MD Initiated Contact with Patient 05/31/23 1516     (approximate)   History   Numbness  HPI  Sheryl Tucker is a 28 y.o. female with a history of depressive disorder, hypothyroidism, PTSD, and kidney stones, presents to the ED for evaluation of right arm paresthesias. She would report about 2 hours of onset before she presented to the ED. She describes onset of pain in the mid scapular region on the right, that is reproducible with palpation.  She also endorses pain is aggravated by taking a chin down to the chest.  She would deny any grip changes, weakness, or paralysis.  No chest pain or shortness of breath reported.  She denies injury, trauma, CP, SOB, or general weakness.  She denies skin temp or color changes. No edema, swelling, or bruising.  On further discussion, the patient believes she may have aggravated the muscle by doing some heavy lifting and the 2 consecutive days prior.   Physical Exam   Triage Vital Signs: ED Triage Vitals  Encounter Vitals Group     BP 05/31/23 1352 114/69     Systolic BP Percentile --      Diastolic BP Percentile --      Pulse Rate 05/31/23 1352 82     Resp 05/31/23 1352 18     Temp 05/31/23 1352 97.7 F (36.5 C)     Temp Source 05/31/23 1352 Oral     SpO2 05/31/23 1352 100 %     Weight 05/31/23 1404 225 lb 15.5 oz (102.5 kg)     Height 05/31/23 1404 5\' 4"  (1.626 m)     Head Circumference --      Peak Flow --      Pain Score 05/31/23 1404 8     Pain Loc --      Pain Education --      Exclude from Growth Chart --     Most recent vital signs: Vitals:   05/31/23 1352  BP: 114/69  Pulse: 82  Resp: 18  Temp: 97.7 F (36.5 C)  SpO2: 100%    General Awake, no distress. NAD HEENT NCAT. PERRL. EOMI. No rhinorrhea. Mucous membranes are moist.  CV:  Good peripheral perfusion. RRR RESP:  Normal effort. CTA ABD:  No distention.   MSK:  Left upper extremity without obvious deformity or dislocation.  Active range of motion is full and fluid.  Normal composite fist distally.  Tender to palpation along the right scapulothoracic muscular region.  Pain is also aggravated with external rotation of the right shoulder.  No rotator cuff deficit is appreciated on exam. NEURO: Nerves II to XII grossly intact.  Normal UE UE DTRs bilaterally.   ED Results / Procedures / Treatments   Labs (all labs ordered are listed, but only abnormal results are displayed) Labs Reviewed  CBC  BASIC METABOLIC PANEL  TSH  T4, FREE   EKG    RADIOLOGY  No results found.   PROCEDURES:  Critical Care performed: No  Procedures   MEDICATIONS ORDERED IN ED: Medications - No data to display   IMPRESSION / MDM / ASSESSMENT AND PLAN / ED COURSE  I reviewed the triage vital signs and the nursing notes.                              Differential  diagnosis includes, but is not limited to, paresthesias, cervical radiculopathy, nerve impingement, Scapoluthoracic muscle strain, muscle spasm, rotator cuff arthropathy  Patient's presentation is most consistent with acute, uncomplicated illness.  Patient's diagnosis is consistent with acute right-sided thoracic muscle strain.  Patient overall reassuring exam and workup at this time.  No indication of internal derangement of the right shoulder.  No evidence or concern for a central nervous lesion.  Patient's exam shows no weakness, paralysis, or functional motor deficits.  Reproducible musculoskeletal pain on the right scapulothoracic region, consistent with likely muscle strain.  Labs are reassuring at this time.  No indication for diagnostic imaging.  Patient will be discharged home with prescriptions for cyclobenzaprine, Lidoderm patches, and Zofran. Patient is to follow up with her PCP as discussed, as needed or otherwise directed. Patient is given ED precautions to return to the ED for any  worsening or new symptoms.   FINAL CLINICAL IMPRESSION(S) / ED DIAGNOSES   Final diagnoses:  Acute right-sided thoracic back pain     Rx / DC Orders   ED Discharge Orders          Ordered    ondansetron (ZOFRAN-ODT) 4 MG disintegrating tablet  Every 8 hours PRN        05/31/23 1557    lidocaine (LIDODERM) 5 %  Every 12 hours PRN        05/31/23 1557    cyclobenzaprine (FLEXERIL) 5 MG tablet  3 times daily PRN        05/31/23 1557             Note:  This document was prepared using Dragon voice recognition software and may include unintentional dictation errors.    Lissa Hoard, PA-C 05/31/23 2311    Corena Herter, MD 05/31/23 2329

## 2023-11-22 ENCOUNTER — Emergency Department
Admission: EM | Admit: 2023-11-22 | Discharge: 2023-11-22 | Disposition: A | Discharge status: Home / Self Care | Payer: Self-pay | Attending: Emergency Medicine | Admitting: Emergency Medicine

## 2023-11-22 ENCOUNTER — Emergency Department: Payer: Self-pay

## 2023-11-22 ENCOUNTER — Other Ambulatory Visit: Payer: Self-pay

## 2023-11-22 DIAGNOSIS — R55 Syncope and collapse: Secondary | ICD-10-CM | POA: Insufficient documentation

## 2023-11-22 DIAGNOSIS — F410 Panic disorder [episodic paroxysmal anxiety] without agoraphobia: Secondary | ICD-10-CM | POA: Insufficient documentation

## 2023-11-22 DIAGNOSIS — R569 Unspecified convulsions: Secondary | ICD-10-CM | POA: Insufficient documentation

## 2023-11-22 DIAGNOSIS — N390 Urinary tract infection, site not specified: Secondary | ICD-10-CM | POA: Insufficient documentation

## 2023-11-22 LAB — URINALYSIS, ROUTINE W REFLEX MICROSCOPIC
Bilirubin Urine: NEGATIVE
Glucose, UA: NEGATIVE mg/dL
Hgb urine dipstick: NEGATIVE
Ketones, ur: 80 mg/dL — AB
Leukocytes,Ua: NEGATIVE
Nitrite: POSITIVE — AB
Protein, ur: 100 mg/dL — AB
Specific Gravity, Urine: 1.027 (ref 1.005–1.030)
pH: 8 (ref 5.0–8.0)

## 2023-11-22 LAB — COMPREHENSIVE METABOLIC PANEL WITH GFR
ALT: 21 U/L (ref 0–44)
AST: 22 U/L (ref 15–41)
Albumin: 4.1 g/dL (ref 3.5–5.0)
Alkaline Phosphatase: 92 U/L (ref 38–126)
Anion gap: 15 (ref 5–15)
BUN: 10 mg/dL (ref 6–20)
CO2: 20 mmol/L — ABNORMAL LOW (ref 22–32)
Calcium: 9.7 mg/dL (ref 8.9–10.3)
Chloride: 104 mmol/L (ref 98–111)
Creatinine, Ser: 0.73 mg/dL (ref 0.44–1.00)
GFR, Estimated: >60 mL/min (ref >60)
Glucose, Bld: 95 mg/dL (ref 70–99)
Potassium: 3.4 mmol/L — ABNORMAL LOW (ref 3.5–5.1)
Sodium: 139 mmol/L (ref 135–145)
Total Bilirubin: 0.8 mg/dL (ref 0.0–1.2)
Total Protein: 7.6 g/dL (ref 6.5–8.1)

## 2023-11-22 LAB — CBC
HCT: 43.4 % (ref 36.0–46.0)
Hemoglobin: 14.5 g/dL (ref 12.0–15.0)
MCH: 27.9 pg (ref 26.0–34.0)
MCHC: 33.4 g/dL (ref 30.0–36.0)
MCV: 83.5 fL (ref 80.0–100.0)
Platelets: 356 K/uL (ref 150–400)
RBC: 5.2 MIL/uL — ABNORMAL HIGH (ref 3.87–5.11)
RDW: 13.2 % (ref 11.5–15.5)
WBC: 9.5 K/uL (ref 4.0–10.5)
nRBC: 0 % (ref 0.0–0.2)

## 2023-11-22 LAB — LIPASE, BLOOD: Lipase: 26 U/L (ref 11–51)

## 2023-11-22 LAB — POC URINE PREG, ED: Preg Test, Ur: NEGATIVE

## 2023-11-22 MED ORDER — PROMETHAZINE HCL 25 MG PO TABS: 12.5000 mg | ORAL_TABLET | Freq: Once | ORAL | Status: DC

## 2023-11-22 MED ORDER — PROMETHAZINE HCL 25 MG PO TABS
25.0000 mg | ORAL_TABLET | Freq: Once | ORAL | Status: AC
  Administered 2023-11-22: 25 mg via ORAL
  Filled 2023-11-22: qty 1

## 2023-11-22 MED ORDER — CEPHALEXIN 500 MG PO CAPS: 500.0000 mg | ORAL_CAPSULE | Freq: Two times a day (BID) | ORAL | 0 refills | Status: AC

## 2023-11-22 MED ORDER — PROMETHAZINE HCL 12.5 MG PO TABS: 12.5000 mg | ORAL_TABLET | Freq: Three times a day (TID) | ORAL | 0 refills | Status: AC | PRN

## 2023-11-22 MED ORDER — ONDANSETRON 4 MG PO TBDP
4.0000 mg | ORAL_TABLET | Freq: Once | ORAL | Status: AC
  Administered 2023-11-22: 4 mg via ORAL
  Filled 2023-11-22: qty 1

## 2023-11-22 NOTE — ED Triage Notes (Addendum)
 Pt to ED via [facility] from work. EMS states pt had an anxiety attack at work. Pt presents with eyes closed and all over shaking. Pt responds to verbal stimuli but keeps eyes closed and continues shaking.   When asked what brings pt in she states she drove then states she didn't drive. When asked what symptoms pt is having she states vomiting and then stated, no I don't know. Pt reports generalized abd pain. Pt very poor historian at time of triage.   Pt denies SI/HI

## 2023-11-22 NOTE — ED Triage Notes (Signed)
 Patient to ED via [facility] from work for psych evaluations. 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 PT had an anxiety attack at work. Hx of same and depression.   119/59 60 HR 99% RA 93 cbg

## 2023-11-22 NOTE — ED Provider Notes (Signed)
 [facility] Provider Note    Event Date/Time   First MD Initiated Contact with Patient 11/22/23 1651     (approximate)   History   Abdominal Pain, Emesis, and Anxiety   HPI  Sheryl Tucker is a 28 y.o. female with no active medical problems, not on any medications, who presents with an episode of passing out with possible seizure activity this afternoon.  The patient reports that her symptoms started yesterday afternoon after eating [employer].  She had nausea and vomiting but no abdominal pain.  She had several more episodes of vomiting during the evening.  This morning she was nauseous but was been feeling somewhat better.  However she had a relatively large meal for lunch and then started having intense nausea again.  Subsequently she went into what she described as a panic attack.  At that point she felt very lightheaded and weak and passed out.  She states that her coworkers noted that she had possible seizures but that EMS thought that they were pseudoseizures.  On arrival to the ED she continued with some twitching and apparently had difficulty speaking.  She states that since symptoms have now completely resolved other than some remaining nausea.  She has no abdominal pain.  She has no headache.  She denies any weakness or numbness.  She has no vision changes or difficulty with speech.  She has no prior history of seizures.  I reviewed the past medical records.  The patient's most recent prior encounter in our system was in February in the ED for arm paresthesias.  Her most recent non-ED encounter was with podiatry in February 2024 for ankle pain.  Physical Exam   Triage Vital Signs: ED Triage Vitals [11/22/23 1424]  Encounter Vitals Group     BP (!) 93/52     Girls Systolic BP Percentile      Girls Diastolic BP Percentile      Boys Systolic BP Percentile      Boys Diastolic BP Percentile      Pulse Rate 60     Resp 18     Temp 98 F (36.7 C)      Temp Source Oral     SpO2 100 %     Weight      Height      Head Circumference      Peak Flow      Pain Score 6     Pain Loc      Pain Education      Exclude from Growth Chart     Most recent vital signs: Vitals:   11/22/23 2051 11/22/23 2101  BP: 107/81   Pulse: 61   Resp: (!) 22   Temp:  98.3 F (36.8 C)  SpO2: 100%      General: Alert, comfortable appearing,, no distress.  CV:  Good peripheral perfusion.  Resp:  Normal effort.  Lungs CTAB. Abd:  Soft and nontender.  No distention.  Other:  EOMI.  PERRLA.  No photophobia.  Normal speech.  No facial droop.  Motor intact in all extremities.  No ataxia.   ED Results / Procedures / Treatments   Labs (all labs ordered are listed, but only abnormal results are displayed) Labs Reviewed  COMPREHENSIVE METABOLIC PANEL WITH GFR - Abnormal; Notable for the following components:      Result Value   Potassium 3.4 (*)    CO2 20 (*)    All other components within normal  limits  CBC - Abnormal; Notable for the following components:   RBC 5.20 (*)    All other components within normal limits  URINALYSIS, ROUTINE W REFLEX MICROSCOPIC - Abnormal; Notable for the following components:   Color, Urine YELLOW (*)    APPearance HAZY (*)    Ketones, ur 80 (*)    Protein, ur 100 (*)    Nitrite POSITIVE (*)    Bacteria, UA RARE (*)    All other components within normal limits  LIPASE, BLOOD  POC URINE PREG, ED     EKG  ED ECG REPORT I, Waylon Cassis, the attending physician, personally viewed and interpreted this ECG.  Date: 11/22/2023 EKG Time: 1755 Rate: 55 Rhythm: normal sinus rhythm QRS Axis: normal Intervals: Incomplete RBBB ST/T Wave abnormalities: normal Narrative Interpretation: no evidence of acute ischemia    RADIOLOGY  CT head: I independently viewed and interpreted the images; there is no ICH.  Radiology report indicates no acute abnormality.   PROCEDURES:  Critical Care performed:  No  Procedures   MEDICATIONS ORDERED IN ED: Medications  ondansetron  (ZOFRAN -ODT) disintegrating tablet 4 mg (4 mg Oral Given 11/22/23 1752)  promethazine  (PHENERGAN ) tablet 25 mg (25 mg Oral Given 11/22/23 1933)     IMPRESSION / MDM / ASSESSMENT AND PLAN / ED COURSE  I reviewed the triage vital signs and the nursing notes.  28 year old female with PMH as noted above presents with initially nausea and vomiting, followed by an apparent anxiety/panic attack, and subsequent syncope with possible seizure-like activity although EMS noted that her symptoms were more consistent with pseudoseizure.  On my exam, the patient is back to baseline, alert and with a nonfocal neurologic exam.  Differential diagnosis includes, but is not limited to, vasovagal syncope, acute anxiety/panic, hyperventilation, electrolyte abnormality, other metabolic disturbance, pseudoseizure, dystonic reaction, less likely epileptic seizures.  CMP and CBC show no acute findings.  We will obtain the EKG, CT head, urinalysis, and reassess.  Patient's presentation is most consistent with acute presentation with potential threat to life or bodily function.  The patient is on the cardiac monitor to evaluate for evidence of arrhythmia and/or significant heart rate changes.   ----------------------------------------- 8:58 PM on 11/22/2023 -----------------------------------------  CT head is negative.  EKG shows sinus arrhythmia and is unremarkable.  UA shows a possible UTI.  The patient is not having any urinary symptoms but given that it is nitrite positive we will treat.  Her nausea has resolved.  She is feeling much better.  At this time, she is stable for discharge home.  I have a low suspicion that the patient actually had a seizure, so she does not require any activity restrictions.  This is more consistent with syncope and anxiety.  The patient can follow-up with her primary care provider.  I gave strict return precautions,  and she expressed understanding.   FINAL CLINICAL IMPRESSION(S) / ED DIAGNOSES   Final diagnoses:  Syncope, unspecified syncope type  Panic attack  Seizure-like activity (HCC)  Urinary tract infection without hematuria, site unspecified     Rx / DC Orders   ED Discharge Orders          Ordered    cephALEXin  (KEFLEX ) 500 MG capsule  2 times daily        11/22/23 2056    promethazine  (PHENERGAN ) 12.5 MG tablet  Every 8 hours PRN        11/22/23 2057  Note:  This document was prepared using Dragon voice recognition software and may include unintentional dictation errors.    Jacolyn Pae, MD 11/22/23 [id number]

## 2023-11-22 NOTE — ED Notes (Signed)
 Patient transported to CT

## 2023-11-22 NOTE — Discharge Instructions (Addendum)
 Take the antibiotic as prescribed and finish the full course.  You may take the Phenergan  if needed for nausea or vomiting.  Gradually advance your diet over the next few days.  Do not eat anything too heavy over the next day.  Follow-up with your primary care provider.  Return to the ER for new, worsening, or persistent severe nausea or vomiting, abdominal pain, fever, weakness, recurrent episodes of passing out, seizure-like episodes, or any other new or worsening symptoms that concern you.
